# Patient Record
Sex: Male | Born: 1960 | Race: Black or African American | Hispanic: No | Marital: Single
Health system: Southern US, Community
[De-identification: ages and names within clinical notes are randomized; demographics above are authoritative.]

## PROBLEM LIST (undated history)

## (undated) DIAGNOSIS — M6740 Ganglion, unspecified site: Secondary | ICD-10-CM

## (undated) DIAGNOSIS — E7800 Pure hypercholesterolemia, unspecified: Secondary | ICD-10-CM

## (undated) DIAGNOSIS — E785 Hyperlipidemia, unspecified: Secondary | ICD-10-CM

## (undated) HISTORY — PX: HERNIA REPAIR: SHX51

## (undated) HISTORY — DX: Hyperlipidemia, unspecified: E78.5

---

## 2005-04-26 ENCOUNTER — Encounter: Admission: RE | Admit: 2005-04-26 | Discharge: 2005-04-26 | Payer: Self-pay | Admitting: Family Medicine

## 2006-11-07 ENCOUNTER — Encounter: Admission: RE | Admit: 2006-11-07 | Discharge: 2006-11-07 | Payer: Self-pay | Admitting: Family Medicine

## 2007-03-14 ENCOUNTER — Encounter: Admission: RE | Admit: 2007-03-14 | Discharge: 2007-06-12 | Payer: Self-pay | Admitting: Family Medicine

## 2007-04-23 ENCOUNTER — Emergency Department (HOSPITAL_COMMUNITY): Admission: EM | Admit: 2007-04-23 | Discharge: 2007-04-23 | Payer: Self-pay | Admitting: Emergency Medicine

## 2007-09-26 ENCOUNTER — Encounter: Admission: RE | Admit: 2007-09-26 | Discharge: 2007-09-26 | Payer: Self-pay | Admitting: Family Medicine

## 2009-01-15 ENCOUNTER — Encounter: Admission: RE | Admit: 2009-01-15 | Discharge: 2009-01-15 | Payer: Self-pay | Admitting: Family Medicine

## 2009-01-16 ENCOUNTER — Encounter: Admission: RE | Admit: 2009-01-16 | Discharge: 2009-01-16 | Payer: Self-pay | Admitting: Neurosurgery

## 2010-12-03 ENCOUNTER — Emergency Department (HOSPITAL_COMMUNITY)
Admission: EM | Admit: 2010-12-03 | Discharge: 2010-12-03 | Disposition: A | Discharge status: Home / Self Care | Payer: BC Managed Care – PPO | Attending: Emergency Medicine | Admitting: Emergency Medicine

## 2010-12-03 ENCOUNTER — Encounter (HOSPITAL_COMMUNITY): Payer: Self-pay

## 2010-12-03 ENCOUNTER — Emergency Department (HOSPITAL_COMMUNITY): Payer: BC Managed Care – PPO

## 2010-12-03 DIAGNOSIS — R109 Unspecified abdominal pain: Secondary | ICD-10-CM | POA: Insufficient documentation

## 2010-12-03 DIAGNOSIS — N201 Calculus of ureter: Secondary | ICD-10-CM | POA: Insufficient documentation

## 2010-12-03 DIAGNOSIS — E7800 Pure hypercholesterolemia, unspecified: Secondary | ICD-10-CM | POA: Insufficient documentation

## 2010-12-03 LAB — URINE MICROSCOPIC-ADD ON

## 2010-12-03 LAB — URINALYSIS, ROUTINE W REFLEX MICROSCOPIC
Bilirubin Urine: NEGATIVE
pH: 5 (ref 5.0–8.0)

## 2010-12-04 LAB — POCT I-STAT, CHEM 8
BUN: 20 mg/dL (ref 6–23)
Calcium, Ion: 1.19 mmol/L (ref 1.12–1.32)
Creatinine, Ser: 1.4 mg/dL (ref 0.4–1.5)
Hemoglobin: 17 g/dL (ref 13.0–17.0)
Potassium: 4.6 mEq/L (ref 3.5–5.1)

## 2010-12-06 ENCOUNTER — Emergency Department (HOSPITAL_COMMUNITY)
Admission: EM | Admit: 2010-12-06 | Discharge: 2010-12-06 | Disposition: A | Discharge status: Home / Self Care | Payer: BC Managed Care – PPO | Attending: Emergency Medicine | Admitting: Emergency Medicine

## 2010-12-06 ENCOUNTER — Emergency Department (HOSPITAL_COMMUNITY): Payer: BC Managed Care – PPO

## 2010-12-06 DIAGNOSIS — N23 Unspecified renal colic: Secondary | ICD-10-CM | POA: Insufficient documentation

## 2010-12-06 DIAGNOSIS — N289 Disorder of kidney and ureter, unspecified: Secondary | ICD-10-CM | POA: Insufficient documentation

## 2010-12-06 DIAGNOSIS — R109 Unspecified abdominal pain: Secondary | ICD-10-CM | POA: Insufficient documentation

## 2010-12-06 LAB — URINALYSIS, ROUTINE W REFLEX MICROSCOPIC
Protein, ur: 30 mg/dL — AB
Specific Gravity, Urine: 1.039 — ABNORMAL HIGH (ref 1.005–1.030)
Urobilinogen, UA: 0.2 mg/dL (ref 0.0–1.0)

## 2010-12-06 LAB — POCT I-STAT, CHEM 8
Calcium, Ion: 1.1 mmol/L — ABNORMAL LOW (ref 1.12–1.32)
Chloride: 104 mEq/L (ref 96–112)
Creatinine, Ser: 1.9 mg/dL — ABNORMAL HIGH (ref 0.4–1.5)
Glucose, Bld: 99 mg/dL (ref 70–99)
Hemoglobin: 15.6 g/dL (ref 13.0–17.0)

## 2010-12-06 LAB — URINE MICROSCOPIC-ADD ON

## 2010-12-07 LAB — URINE CULTURE: Culture: NO GROWTH

## 2010-12-09 ENCOUNTER — Inpatient Hospital Stay (HOSPITAL_COMMUNITY): Payer: BC Managed Care – PPO

## 2010-12-09 ENCOUNTER — Inpatient Hospital Stay (HOSPITAL_COMMUNITY)
Admission: EM | Admit: 2010-12-09 | Discharge: 2010-12-10 | DRG: 323 | Disposition: A | Discharge status: Home / Self Care | Payer: BC Managed Care – PPO | Attending: Urology | Admitting: Urology

## 2010-12-09 DIAGNOSIS — N201 Calculus of ureter: Principal | ICD-10-CM | POA: Diagnosis present

## 2010-12-09 DIAGNOSIS — Z79899 Other long term (current) drug therapy: Secondary | ICD-10-CM

## 2010-12-09 DIAGNOSIS — E7800 Pure hypercholesterolemia, unspecified: Secondary | ICD-10-CM | POA: Diagnosis present

## 2010-12-09 DIAGNOSIS — N200 Calculus of kidney: Secondary | ICD-10-CM | POA: Diagnosis present

## 2010-12-09 DIAGNOSIS — Z87442 Personal history of urinary calculi: Secondary | ICD-10-CM

## 2010-12-09 DIAGNOSIS — N1330 Unspecified hydronephrosis: Secondary | ICD-10-CM | POA: Diagnosis present

## 2010-12-09 LAB — URINE MICROSCOPIC-ADD ON

## 2010-12-09 LAB — POCT I-STAT, CHEM 8
BUN: 16 mg/dL (ref 6–23)
Chloride: 104 mEq/L (ref 96–112)
Creatinine, Ser: 1.5 mg/dL (ref 0.4–1.5)
Glucose, Bld: 111 mg/dL — ABNORMAL HIGH (ref 70–99)
Potassium: 4.2 mEq/L (ref 3.5–5.1)
Sodium: 138 mEq/L (ref 135–145)
TCO2: 25 mmol/L (ref 0–100)

## 2010-12-09 LAB — URINALYSIS, ROUTINE W REFLEX MICROSCOPIC
Bilirubin Urine: NEGATIVE
Ketones, ur: NEGATIVE mg/dL
Nitrite: NEGATIVE

## 2010-12-10 ENCOUNTER — Inpatient Hospital Stay (HOSPITAL_COMMUNITY): Payer: BC Managed Care – PPO

## 2011-04-03 HISTORY — PX: SHOULDER SURGERY: SHX246

## 2011-05-13 LAB — CBC
Platelets: 188
RBC: 4.67

## 2011-05-13 LAB — DIFFERENTIAL
Basophils Absolute: 0.1
Eosinophils Absolute: 0.2
Eosinophils Relative: 1
Lymphs Abs: 3.4 — ABNORMAL HIGH
Monocytes Absolute: 0.7

## 2011-05-13 LAB — I-STAT 8, (EC8 V) (CONVERTED LAB)
BUN: 15
Bicarbonate: 26.7 — ABNORMAL HIGH
HCT: 46
Operator id: 279831
pCO2, Ven: 40 — ABNORMAL LOW
pH, Ven: 7.433 — ABNORMAL HIGH

## 2011-05-13 LAB — URINE MICROSCOPIC-ADD ON

## 2011-05-13 LAB — URINALYSIS, ROUTINE W REFLEX MICROSCOPIC
Glucose, UA: NEGATIVE
Specific Gravity, Urine: 1.025
Urobilinogen, UA: 1

## 2011-05-13 LAB — POCT I-STAT CREATININE: Creatinine, Ser: 1.4

## 2012-01-04 ENCOUNTER — Encounter (INDEPENDENT_AMBULATORY_CARE_PROVIDER_SITE_OTHER): Payer: Self-pay

## 2012-01-24 ENCOUNTER — Encounter (INDEPENDENT_AMBULATORY_CARE_PROVIDER_SITE_OTHER): Payer: Self-pay | Admitting: Surgery

## 2012-01-24 ENCOUNTER — Ambulatory Visit (INDEPENDENT_AMBULATORY_CARE_PROVIDER_SITE_OTHER): Payer: BC Managed Care – PPO | Admitting: Surgery

## 2012-01-24 VITALS — BP 124/74 | HR 62 | Temp 97.3°F | Ht 75.0 in | Wt 215.4 lb

## 2012-01-24 DIAGNOSIS — K429 Umbilical hernia without obstruction or gangrene: Secondary | ICD-10-CM

## 2012-01-24 NOTE — Progress Notes (Signed)
Subjective:     Patient ID: Allen Rodriguez, male   DOB: 1960-11-12, 51 y.o.   MRN: [id number]  HPI  DOMINGO FUSON  June 19, 1961 [id number]  Patient Care Team: Benita Stabile, MD as PCP - General (Family Medicine)  This patient is a 51 y.o.male who presents today for surgical evaluation at the request of Dr. Clovis Riley.   Reason for visit: Hernia near her bellybutton.  The patient is an active healthy male. He felt a painful lump above his bellybutton a few months ago. Saw his primary care physician. The lump did not go away. It intermittently comes and goes. It is annoying and bothersome at times. No nausea or vomiting. Has daily bowel movements. No history of prior hernias. No history of infections. Because the mass has become more symptomatic and has not resolved, Dr. Clovis Riley sent the patient to consider surgery.  Patient Active Problem List  Diagnosis  . Supraumbilical hernia    Past Medical History  Diagnosis Date  . Hyperlipidemia     Past Surgical History  Procedure Date  . Shoulder surgery 09/12    left    History   Social History  . Marital Status: Married    Spouse Name: N/A    Number of Children: N/A  . Years of Education: N/A   Occupational History  . Not on file.   Social History Main Topics  . Smoking status: Never Smoker   . Smokeless tobacco: Not on file  . Alcohol Use: Yes     1-2x week  . Drug Use: No  . Sexually Active:    Other Topics Concern  . Not on file   Social History Narrative  . No narrative on file    Family History  Problem Relation Age of Onset  . Diabetes Father     Current Outpatient Prescriptions  Medication Sig Dispense Refill  . simvastatin (ZOCOR) 40 MG tablet Take 40 mg by mouth every evening.         No Known Allergies  BP 124/74  Pulse 62  Temp 97.3 F (36.3 C) (Temporal)  Ht 6\' 3"  (1.905 m)  Wt 215 lb 6.4 oz (97.705 kg)  BMI 26.92 kg/m2  SpO2 98%  No results found.   Review of Systems    Constitutional: Negative for fever, chills and diaphoresis.  HENT: Negative for nosebleeds, sore throat, facial swelling, mouth sores, trouble swallowing and ear discharge.   Eyes: Negative for photophobia, discharge and visual disturbance.  Respiratory: Negative for choking, chest tightness, shortness of breath and stridor.   Cardiovascular: Negative for chest pain and palpitations.       Patient walks 60 minutes for about 2 miles without difficulty.  No exertional chest/neck/shoulder/arm pain.  Gastrointestinal: Positive for abdominal pain. Negative for nausea, vomiting, diarrhea, constipation, blood in stool, abdominal distention, anal bleeding and rectal pain.  Genitourinary: Negative for dysuria, urgency, difficulty urinating and testicular pain.  Musculoskeletal: Negative for myalgias, back pain, arthralgias and gait problem.  Skin: Negative for color change, pallor, rash and wound.  Neurological: Negative for dizziness, speech difficulty, weakness, numbness and headaches.  Hematological: Negative for adenopathy. Does not bruise/bleed easily.  Psychiatric/Behavioral: Negative for hallucinations, confusion and agitation.       Objective:   Physical Exam  Constitutional: He is oriented to person, place, and time. He appears well-developed and well-nourished. No distress.  HENT:  Head: Normocephalic.  Mouth/Throat: Oropharynx is clear and moist. No oropharyngeal exudate.  Eyes: Conjunctivae and EOM are  normal. Pupils are equal, round, and reactive to light. No scleral icterus.  Neck: Normal range of motion. Neck supple. No tracheal deviation present.  Cardiovascular: Normal rate, regular rhythm and intact distal pulses.   Pulmonary/Chest: Effort normal and breath sounds normal. No respiratory distress.  Abdominal: Soft. He exhibits no distension. There is no tenderness. Hernia confirmed negative in the right inguinal area and confirmed negative in the left inguinal area.     Musculoskeletal: Normal range of motion. He exhibits no tenderness.  Lymphadenopathy:    He has no cervical adenopathy.       Right: No inguinal adenopathy present.       Left: No inguinal adenopathy present.  Neurological: He is alert and oriented to person, place, and time. No cranial nerve deficit. He exhibits normal muscle tone. Coordination normal.  Skin: Skin is warm and dry. No rash noted. He is not diaphoretic. No erythema. No pallor.  Psychiatric: He has a normal mood and affect. His behavior is normal. Judgment and thought content normal.       Assessment:     Supraumbilical VWH    Plan:     Although is a small hernia, it is becoming more comfortable. I offered repair. I think this is small enough that just a primary repair is needed. Probably can do this through an incision through the superior umbilical fold since it is close to the belly button  The anatomy & physiology of the abdominal wall was discussed.  The pathophysiology of hernias was discussed.  Natural history risks without surgery including progeressive enlargement, pain, incarceration & strangulation was discussed.   Contributors to complications such as smoking, obesity, diabetes, prior surgery, etc were discussed.   I feel the risks of no intervention will lead to serious problems that outweigh the operative risks; therefore, I recommended surgery to reduce and repair the hernia.  I explained laparoscopic techniques with possible need for an open approach.  I noted the probable use of mesh to patch and/or buttress the hernia repair  Risks such as bleeding, infection, abscess, need for further treatment, heart attack, death, and other risks were discussed.  I noted a good likelihood this will help address the problem.   Goals of post-operative recovery were discussed as well.  Possibility that this will not correct all symptoms was explained.  I stressed the importance of low-impact activity, aggressive pain  control, avoiding constipation, & not pushing through pain to minimize risk of post-operative chronic pain or injury. Possibility of reherniation especially with smoking, obesity, diabetes, immunosuppression, and other health conditions was discussed.  We will work to minimize complications.     An educational handout further explaining the pathology & treatment options was given as well.  Questions were answered.  The patient expresses understanding & wishes to proceed with surgery.

## 2012-01-24 NOTE — Patient Instructions (Addendum)
Hernia  A hernia occurs when an internal organ pushes out through a weak spot in the abdominal wall. Hernias most commonly occur in the groin and around the navel. Hernias often can be pushed back into place (reduced). Most hernias tend to get worse over time. Some abdominal hernias can get stuck in the opening (irreducible or incarcerated hernia) and cannot be reduced. An irreducible abdominal hernia which is tightly squeezed into the opening is at risk for impaired blood supply (strangulated hernia). A strangulated hernia is a medical emergency. Because of the risk for an irreducible or strangulated hernia, surgery may be recommended to repair a hernia.  CAUSES    Heavy lifting.   Prolonged coughing.   Straining to have a bowel movement.   A cut (incision) made during an abdominal surgery.  HOME CARE INSTRUCTIONS    Bed rest is not required. You may continue your normal activities.   Avoid lifting more than 10 pounds (4.5 kg) or straining.   Cough gently. If you are a smoker it is best to stop. Even the best hernia repair can break down with the continual strain of coughing. Even if you do not have your hernia repaired, a cough will continue to aggravate the problem.   Do not wear anything tight over your hernia. Do not try to keep it in with an outside bandage or truss. These can damage abdominal contents if they are trapped within the hernia sac.   Eat a normal diet.   Avoid constipation. Straining over long periods of time will increase hernia size and encourage breakdown of repairs. If you cannot do this with diet alone, stool softeners may be used.  SEEK IMMEDIATE MEDICAL CARE IF:    You have a fever.   You develop increasing abdominal pain.   You feel nauseous or vomit.   Your hernia is stuck outside the abdomen, looks discolored, feels hard, or is tender.   You have any changes in your bowel habits or in the hernia that are unusual for you.   You have increased pain or swelling around the  hernia.   You cannot push the hernia back in place by applying gentle pressure while lying down.  MAKE SURE YOU:    Understand these instructions.   Will watch your condition.   Will get help right away if you are not doing well or get worse.  Document Released: 07/19/2005 Document Revised: 07/08/2011 Document Reviewed: 03/07/2008  [employer] Patient Information 2012 [employer]

## 2012-02-25 DIAGNOSIS — K436 Other and unspecified ventral hernia with obstruction, without gangrene: Secondary | ICD-10-CM

## 2012-02-25 HISTORY — PX: UMBILICAL HERNIA REPAIR: SHX196

## 2012-03-07 ENCOUNTER — Telehealth (INDEPENDENT_AMBULATORY_CARE_PROVIDER_SITE_OTHER): Payer: Self-pay

## 2012-03-07 NOTE — Telephone Encounter (Signed)
LMOM giving the pt his f/u appt with Dr Michaell Cowing on 04/07/12.

## 2012-03-13 ENCOUNTER — Telehealth (INDEPENDENT_AMBULATORY_CARE_PROVIDER_SITE_OTHER): Payer: Self-pay | Admitting: General Surgery

## 2012-03-13 NOTE — Telephone Encounter (Signed)
Pt calling for post op appt; said no one had called him back.  Informed of appt on 04/07/12.  Discussed reasonable restrictions to employ (no lifting, carrying, pushing or pulling > 20 lbs) until cleared by MD.  He understands.

## 2012-04-07 ENCOUNTER — Encounter (INDEPENDENT_AMBULATORY_CARE_PROVIDER_SITE_OTHER): Payer: Self-pay | Admitting: Surgery

## 2012-04-07 ENCOUNTER — Ambulatory Visit (INDEPENDENT_AMBULATORY_CARE_PROVIDER_SITE_OTHER): Payer: BC Managed Care – PPO | Admitting: Surgery

## 2012-04-07 ENCOUNTER — Encounter (INDEPENDENT_AMBULATORY_CARE_PROVIDER_SITE_OTHER): Payer: BC Managed Care – PPO | Admitting: General Surgery

## 2012-04-07 VITALS — BP 118/78 | HR 80 | Resp 16 | Ht 75.0 in | Wt 219.0 lb

## 2012-04-07 DIAGNOSIS — K429 Umbilical hernia without obstruction or gangrene: Secondary | ICD-10-CM

## 2012-04-07 NOTE — Progress Notes (Signed)
Subjective:     Patient ID: Allen Rodriguez, male   DOB: 12/04/1960, 51 y.o.   MRN: [id number]  HPI  Allen Rodriguez  08-14-1960 [id number]  Patient Care Team: Allen Stabile, MD as PCP - General (Family Medicine)  This patient is a 51 y.o.male who presents today for surgical evaluation.   Procedure: Open supraumbilical ventral hernia repair.  Patient comes today feeling well.  Six weeks out from surgery.  No pain.  Energy level good.  No fevers or chills.  Regular bowel movements.  Patient Active Problem List  Diagnosis  . Supraumbilical hernia    Past Medical History  Diagnosis Date  . Hyperlipidemia     Past Surgical History  Procedure Date  . Shoulder surgery 09/12    left  . Umbilical hernia repair 02/25/12    History   Social History  . Marital Status: Married    Spouse Name: N/A    Number of Children: N/A  . Years of Education: N/A   Occupational History  . Not on file.   Social History Main Topics  . Smoking status: Never Smoker   . Smokeless tobacco: Not on file  . Alcohol Use: Yes     1-2x week  . Drug Use: No  . Sexually Active:    Other Topics Concern  . Not on file   Social History Narrative  . No narrative on file    Family History  Problem Relation Age of Onset  . Diabetes Father     Current Outpatient Prescriptions  Medication Sig Dispense Refill  . simvastatin (ZOCOR) 40 MG tablet Take 40 mg by mouth every evening.         No Known Allergies  BP 118/78  Pulse 80  Resp 16  Ht 6\' 3"  (1.905 m)  Wt 219 lb (99.338 kg)  BMI 27.37 kg/m2  No results found.   Review of Systems  Constitutional: Negative for fever, chills and diaphoresis.  HENT: Negative for sore throat, trouble swallowing and neck pain.   Eyes: Negative for photophobia and visual disturbance.  Respiratory: Negative for choking and shortness of breath.   Cardiovascular: Negative for chest pain and palpitations.  Gastrointestinal: Negative for nausea,  vomiting, abdominal distention, anal bleeding and rectal pain.  Genitourinary: Negative for dysuria, urgency, difficulty urinating and testicular pain.  Musculoskeletal: Negative for myalgias, arthralgias and gait problem.  Skin: Negative for color change and rash.  Neurological: Negative for dizziness, speech difficulty, weakness and numbness.  Hematological: Negative for adenopathy.  Psychiatric/Behavioral: Negative for hallucinations, confusion and agitation.       Objective:   Physical Exam  Constitutional: He is oriented to person, place, and time. He appears well-developed and well-nourished. No distress.  HENT:  Head: Normocephalic.  Mouth/Throat: Oropharynx is clear and moist. No oropharyngeal exudate.  Eyes: Conjunctivae and EOM are normal. Pupils are equal, round, and reactive to light. No scleral icterus.  Neck: Normal range of motion. No tracheal deviation present.  Cardiovascular: Normal rate, normal heart sounds and intact distal pulses.   Pulmonary/Chest: Effort normal. No respiratory distress.  Abdominal: Soft. He exhibits no distension. There is no tenderness. Hernia confirmed negative in the right inguinal area and confirmed negative in the left inguinal area.       Incisions clean with normal healing ridges.  No hernias  Musculoskeletal: Normal range of motion. He exhibits no tenderness.  Neurological: He is alert and oriented to person, place, and time. No cranial nerve deficit. He  exhibits normal muscle tone. Coordination normal.  Skin: Skin is warm and dry. No rash noted. He is not diaphoretic.  Psychiatric: He has a normal mood and affect. His behavior is normal.       Assessment:     6 weeks s/p primary supraumbilical VWH repair, recovering well    Plan:     Increase activity as tolerated.  Do not push through pain.  Advanced on diet as tolerated. Bowel regimen to avoid problems.  Return to clinic p.r.n. The patient expressed understanding and  appreciation

## 2015-04-18 ENCOUNTER — Encounter (HOSPITAL_BASED_OUTPATIENT_CLINIC_OR_DEPARTMENT_OTHER): Payer: Self-pay | Admitting: *Deleted

## 2015-04-18 ENCOUNTER — Other Ambulatory Visit: Payer: Self-pay | Admitting: Orthopedic Surgery

## 2015-04-24 ENCOUNTER — Encounter (HOSPITAL_BASED_OUTPATIENT_CLINIC_OR_DEPARTMENT_OTHER): Payer: Self-pay

## 2015-04-24 ENCOUNTER — Ambulatory Visit (HOSPITAL_BASED_OUTPATIENT_CLINIC_OR_DEPARTMENT_OTHER)
Admission: RE | Admit: 2015-04-24 | Discharge: 2015-04-24 | Disposition: A | Discharge status: Home / Self Care | Payer: BLUE CROSS/BLUE SHIELD | Source: Ambulatory Visit | Attending: Orthopedic Surgery | Admitting: Orthopedic Surgery

## 2015-04-24 ENCOUNTER — Ambulatory Visit (HOSPITAL_BASED_OUTPATIENT_CLINIC_OR_DEPARTMENT_OTHER): Payer: BLUE CROSS/BLUE SHIELD | Admitting: Anesthesiology

## 2015-04-24 ENCOUNTER — Encounter (HOSPITAL_BASED_OUTPATIENT_CLINIC_OR_DEPARTMENT_OTHER)
Admission: RE | Disposition: A | Discharge status: Home / Self Care | Payer: Self-pay | Source: Ambulatory Visit | Attending: Orthopedic Surgery

## 2015-04-24 DIAGNOSIS — E785 Hyperlipidemia, unspecified: Secondary | ICD-10-CM | POA: Insufficient documentation

## 2015-04-24 DIAGNOSIS — L570 Actinic keratosis: Secondary | ICD-10-CM | POA: Insufficient documentation

## 2015-04-24 DIAGNOSIS — M67844 Other specified disorders of tendon, left hand: Secondary | ICD-10-CM | POA: Diagnosis present

## 2015-04-24 DIAGNOSIS — D367 Benign neoplasm of other specified sites: Secondary | ICD-10-CM | POA: Diagnosis not present

## 2015-04-24 HISTORY — DX: Ganglion, unspecified site: M67.40

## 2015-04-24 HISTORY — PX: MASS EXCISION: SHX2000

## 2015-04-24 SURGERY — EXCISION MASS
Anesthesia: Monitor Anesthesia Care | Site: Finger | Laterality: Left

## 2015-04-24 MED ORDER — CEFAZOLIN SODIUM-DEXTROSE 2-3 GM-% IV SOLR
INTRAVENOUS | Status: AC
  Filled 2015-04-24: qty 50

## 2015-04-24 MED ORDER — DEXAMETHASONE SODIUM PHOSPHATE 10 MG/ML IJ SOLN
INTRAMUSCULAR | Status: AC
  Filled 2015-04-24: qty 1

## 2015-04-24 MED ORDER — GLYCOPYRROLATE 0.2 MG/ML IJ SOLN: 0.2000 mg | Freq: Once | INTRAMUSCULAR | Status: DC | PRN

## 2015-04-24 MED ORDER — LIDOCAINE HCL (CARDIAC) 20 MG/ML IV SOLN
INTRAVENOUS | Status: AC
  Filled 2015-04-24: qty 5

## 2015-04-24 MED ORDER — CHLORHEXIDINE GLUCONATE 4 % EX LIQD: 60.0000 mL | Freq: Once | CUTANEOUS | Status: DC

## 2015-04-24 MED ORDER — LACTATED RINGERS IV SOLN
INTRAVENOUS | Status: DC
  Administered 2015-04-24 (×2): via INTRAVENOUS

## 2015-04-24 MED ORDER — BUPIVACAINE HCL (PF) 0.25 % IJ SOLN
INTRAMUSCULAR | Status: DC | PRN
  Administered 2015-04-24: 10 mL

## 2015-04-24 MED ORDER — FENTANYL CITRATE (PF) 100 MCG/2ML IJ SOLN
INTRAMUSCULAR | Status: AC
  Filled 2015-04-24: qty 4

## 2015-04-24 MED ORDER — HYDROCODONE-ACETAMINOPHEN 5-325 MG PO TABS: ORAL_TABLET | ORAL | Status: AC

## 2015-04-24 MED ORDER — MIDAZOLAM HCL 2 MG/2ML IJ SOLN
1.0000 mg | INTRAMUSCULAR | Status: DC | PRN
  Administered 2015-04-24: 2 mg via INTRAVENOUS

## 2015-04-24 MED ORDER — SCOPOLAMINE 1 MG/3DAYS TD PT72: 1.0000 | MEDICATED_PATCH | Freq: Once | TRANSDERMAL | Status: DC | PRN

## 2015-04-24 MED ORDER — FENTANYL CITRATE (PF) 100 MCG/2ML IJ SOLN: 25.0000 ug | INTRAMUSCULAR | Status: DC | PRN

## 2015-04-24 MED ORDER — FENTANYL CITRATE (PF) 100 MCG/2ML IJ SOLN
50.0000 ug | INTRAMUSCULAR | Status: DC | PRN
  Administered 2015-04-24: 100 ug via INTRAVENOUS

## 2015-04-24 MED ORDER — ONDANSETRON HCL 4 MG/2ML IJ SOLN
INTRAMUSCULAR | Status: DC | PRN
  Administered 2015-04-24: 4 mg via INTRAVENOUS

## 2015-04-24 MED ORDER — PROPOFOL INFUSION 10 MG/ML OPTIME
INTRAVENOUS | Status: DC | PRN
  Administered 2015-04-24: 100 ug/kg/min via INTRAVENOUS

## 2015-04-24 MED ORDER — ONDANSETRON HCL 4 MG/2ML IJ SOLN
INTRAMUSCULAR | Status: AC
  Filled 2015-04-24: qty 2

## 2015-04-24 MED ORDER — LIDOCAINE HCL (CARDIAC) 20 MG/ML IV SOLN
INTRAVENOUS | Status: DC | PRN
  Administered 2015-04-24: 50 mg via INTRAVENOUS

## 2015-04-24 MED ORDER — CEFAZOLIN SODIUM-DEXTROSE 2-3 GM-% IV SOLR
2.0000 g | INTRAVENOUS | Status: AC
  Administered 2015-04-24: 2 g via INTRAVENOUS

## 2015-04-24 MED ORDER — MIDAZOLAM HCL 2 MG/2ML IJ SOLN
INTRAMUSCULAR | Status: AC
  Filled 2015-04-24: qty 4

## 2015-04-24 MED ORDER — PROMETHAZINE HCL 25 MG/ML IJ SOLN
6.2500 mg | INTRAMUSCULAR | Status: DC | PRN
Start: 2015-04-24 — End: 2015-04-24

## 2015-04-24 SURGICAL SUPPLY — 57 items
APL SKNCLS STERI-STRIP NONHPOA (GAUZE/BANDAGES/DRESSINGS)
BANDAGE COBAN STERILE 2 (GAUZE/BANDAGES/DRESSINGS) IMPLANT
BANDAGE ELASTIC 3 VELCRO ST LF (GAUZE/BANDAGES/DRESSINGS) IMPLANT
BENZOIN TINCTURE PRP APPL 2/3 (GAUZE/BANDAGES/DRESSINGS) IMPLANT
BLADE MINI RND TIP GREEN BEAV (BLADE) IMPLANT
BLADE SURG 15 STRL LF DISP TIS (BLADE) ×2 IMPLANT
BLADE SURG 15 STRL SS (BLADE) ×4
BNDG CMPR 9X4 STRL LF SNTH (GAUZE/BANDAGES/DRESSINGS) ×1
BNDG CMPR MD 5X2 ELC HKLP STRL (GAUZE/BANDAGES/DRESSINGS)
BNDG COHESIVE 1X5 TAN STRL LF (GAUZE/BANDAGES/DRESSINGS) ×1 IMPLANT
BNDG CONFORM 2 STRL LF (GAUZE/BANDAGES/DRESSINGS) IMPLANT
BNDG ELASTIC 2 VLCR STRL LF (GAUZE/BANDAGES/DRESSINGS) IMPLANT
BNDG ESMARK 4X9 LF (GAUZE/BANDAGES/DRESSINGS) ×1 IMPLANT
BNDG GAUZE 1X2.1 STRL (MISCELLANEOUS) IMPLANT
BNDG GAUZE ELAST 4 BULKY (GAUZE/BANDAGES/DRESSINGS) IMPLANT
BNDG PLASTER X FAST 3X3 WHT LF (CAST SUPPLIES) IMPLANT
BNDG PLSTR 9X3 FST ST WHT (CAST SUPPLIES)
CHLORAPREP W/TINT 26ML (MISCELLANEOUS) ×2 IMPLANT
CORDS BIPOLAR (ELECTRODE) ×2 IMPLANT
COVER BACK TABLE 60X90IN (DRAPES) ×2 IMPLANT
COVER MAYO STAND STRL (DRAPES) ×2 IMPLANT
CUFF TOURNIQUET SINGLE 18IN (TOURNIQUET CUFF) ×2 IMPLANT
DRAPE EXTREMITY T 121X128X90 (DRAPE) ×2 IMPLANT
DRAPE SURG 17X23 STRL (DRAPES) ×2 IMPLANT
GAUZE SPONGE 4X4 12PLY STRL (GAUZE/BANDAGES/DRESSINGS) ×2 IMPLANT
GAUZE XEROFORM 1X8 LF (GAUZE/BANDAGES/DRESSINGS) ×2 IMPLANT
GLOVE BIO SURGEON STRL SZ7.5 (GLOVE) ×2 IMPLANT
GLOVE BIOGEL PI IND STRL 7.0 (GLOVE) IMPLANT
GLOVE BIOGEL PI IND STRL 8 (GLOVE) ×1 IMPLANT
GLOVE BIOGEL PI INDICATOR 7.0 (GLOVE) ×2
GLOVE BIOGEL PI INDICATOR 8 (GLOVE) ×1
GLOVE ECLIPSE 6.5 STRL STRAW (GLOVE) ×1 IMPLANT
GOWN STRL REUS W/ TWL LRG LVL3 (GOWN DISPOSABLE) ×1 IMPLANT
GOWN STRL REUS W/TWL LRG LVL3 (GOWN DISPOSABLE) ×2
GOWN STRL REUS W/TWL XL LVL3 (GOWN DISPOSABLE) ×2 IMPLANT
NDL HYPO 25X1 1.5 SAFETY (NEEDLE) ×1 IMPLANT
NEEDLE HYPO 25X1 1.5 SAFETY (NEEDLE) ×2 IMPLANT
NS IRRIG 1000ML POUR BTL (IV SOLUTION) ×2 IMPLANT
PACK BASIN DAY SURGERY FS (CUSTOM PROCEDURE TRAY) ×2 IMPLANT
PAD CAST 3X4 CTTN HI CHSV (CAST SUPPLIES) IMPLANT
PAD CAST 4YDX4 CTTN HI CHSV (CAST SUPPLIES) IMPLANT
PADDING CAST ABS 4INX4YD NS (CAST SUPPLIES) ×1
PADDING CAST ABS COTTON 4X4 ST (CAST SUPPLIES) ×1 IMPLANT
PADDING CAST COTTON 3X4 STRL (CAST SUPPLIES)
PADDING CAST COTTON 4X4 STRL (CAST SUPPLIES)
SPLINT FINGER 3.25 BULB 911905 (SOFTGOODS) ×1 IMPLANT
STOCKINETTE 4X48 STRL (DRAPES) ×2 IMPLANT
STRIP CLOSURE SKIN 1/2X4 (GAUZE/BANDAGES/DRESSINGS) IMPLANT
SUT ETHILON 3 0 PS 1 (SUTURE) IMPLANT
SUT ETHILON 4 0 PS 2 18 (SUTURE) IMPLANT
SUT ETHILON 5 0 P 3 18 (SUTURE) ×1
SUT NYLON ETHILON 5-0 P-3 1X18 (SUTURE) IMPLANT
SUT VIC AB 4-0 P2 18 (SUTURE) IMPLANT
SYR BULB 3OZ (MISCELLANEOUS) ×2 IMPLANT
SYR CONTROL 10ML LL (SYRINGE) ×2 IMPLANT
TOWEL OR 17X24 6PK STRL BLUE (TOWEL DISPOSABLE) ×2 IMPLANT
UNDERPAD 30X30 (UNDERPADS AND DIAPERS) ×1 IMPLANT

## 2015-04-24 NOTE — Brief Op Note (Signed)
04/24/2015  11:15 AM  PATIENT:  Allen Rodriguez  54 y.o. male  PRE-OPERATIVE DIAGNOSIS:  LEFT SMALL FINGER MUCOID CYST AND DIP  POST-OPERATIVE DIAGNOSIS:  LEFT SMALL FINGER WART  PROCEDURE:  Procedure(s): LEFT SMALL FINGER  EXCISION MASS (Left)  SURGEON:  Surgeon(s) and Role:    * Leanora Cover, MD - Primary  PHYSICIAN ASSISTANT:   ASSISTANTS: none   ANESTHESIA:   Bier block with sedation  EBL:  Total I/O In: 1000 [I.V.:1000] Out: -   BLOOD ADMINISTERED:none  DRAINS: none   LOCAL MEDICATIONS USED:  MARCAINE     SPECIMEN:  Source of Specimen:  left small finger  DISPOSITION OF SPECIMEN:  PATHOLOGY  COUNTS:  YES  TOURNIQUET:   Total Tourniquet Time Documented: Forearm (Left) - 19 minutes Total: Forearm (Left) - 19 minutes   DICTATION: .Other Dictation: Dictation Number [id number]  PLAN OF CARE: Discharge to home after PACU  PATIENT DISPOSITION:  PACU - hemodynamically stable.

## 2015-04-24 NOTE — Anesthesia Preprocedure Evaluation (Signed)
Anesthesia Evaluation  Patient identified by MRN, date of birth, ID band Patient awake    Reviewed: Allergy & Precautions, H&P , NPO status , Patient's Chart, lab work & pertinent test results  History of Anesthesia Complications Negative for: history of anesthetic complications  Airway Mallampati: II  TM Distance: >3 FB Neck ROM: full    Dental no notable dental hx.    Pulmonary neg pulmonary ROS,    Pulmonary exam normal breath sounds clear to auscultation       Cardiovascular negative cardio ROS Normal cardiovascular exam Rhythm:regular Rate:Normal     Neuro/Psych negative neurological ROS     GI/Hepatic negative GI ROS, Neg liver ROS,   Endo/Other  negative endocrine ROS  Renal/GU negative Renal ROS     Musculoskeletal   Abdominal   Peds  Hematology negative hematology ROS (+)   Anesthesia Other Findings   Reproductive/Obstetrics negative OB ROS                             Anesthesia Physical Anesthesia Plan  ASA: II  Anesthesia Plan: Bier Block and MAC   Post-op Pain Management:    Induction: Intravenous  Airway Management Planned: Simple Face Mask  Additional Equipment:   Intra-op Plan:   Post-operative Plan:   Informed Consent: I have reviewed the patients History and Physical, chart, labs and discussed the procedure including the risks, benefits and alternatives for the proposed anesthesia with the patient or authorized representative who has indicated his/her understanding and acceptance.   Dental Advisory Given  Plan Discussed with: Anesthesiologist, CRNA and Surgeon  Anesthesia Plan Comments:         Anesthesia Quick Evaluation

## 2015-04-24 NOTE — Anesthesia Postprocedure Evaluation (Signed)
  Anesthesia Post-op Note  Patient: Allen Rodriguez  Procedure(s) Performed: Procedure(s) (LRB): LEFT SMALL FINGER  EXCISION MASS (Left)  Patient Location: PACU  Anesthesia Type: MAC and Bier block  Level of Consciousness: awake and alert   Airway and Oxygen Therapy: Patient Spontanous Breathing  Post-op Pain: mild  Post-op Assessment: Post-op Vital signs reviewed, Patient's Cardiovascular Status Stable, Respiratory Function Stable, Patent Airway and No signs of Nausea or vomiting  Last Vitals:  Filed Vitals:   04/24/15 1145  BP: 123/87  Pulse: 61  Temp:   Resp: 14    Post-op Vital Signs: stable   Complications: No apparent anesthesia complications

## 2015-04-24 NOTE — H&P (Signed)
  Allen Rodriguez is an 54 y.o. male.   Chief Complaint: left small mucoid cyst HPI: 54 yo rhd male has noted a mass left small finger x 1 year.  It is bothersome to him.  He wishes to have it removed and dip joint debrided.  Past Medical History  Diagnosis Date  . Hyperlipidemia   . Mucoid cyst of joint     left small finger    Past Surgical History  Procedure Laterality Date  . Shoulder surgery  09/12    left  . Umbilical hernia repair  02/25/12  . Hernia repair      Family History  Problem Relation Age of Onset  . Diabetes Father    Social History:  reports that he has never smoked. He does not have any smokeless tobacco history on file. He reports that he drinks alcohol. He reports that he does not use illicit drugs.  Allergies: No Known Allergies  Medications Prior to Admission  Medication Sig Dispense Refill  . simvastatin (ZOCOR) 40 MG tablet Take 40 mg by mouth every evening.      No results found for this or any previous visit (from the past 48 hour(s)).  No results found.   A comprehensive review of systems was negative except for: Eyes: positive for contacts/glasses  Blood pressure 122/88, pulse 55, temperature 97.7 F (36.5 C), temperature source Oral, resp. rate 20, height 6\' 3"  (1.905 m), weight 103.239 kg (227 lb 9.6 oz), SpO2 100 %.  General appearance: alert, cooperative and appears stated age Head: Normocephalic, without obvious abnormality, atraumatic Neck: supple, symmetrical, trachea midline Resp: clear to auscultation bilaterally Cardio: regular rate and rhythm GI: non tender Extremities: intact sensation and capillary refill all digits.  +epl/fpl/io.  no wounds. Pulses: 2+ and symmetric Skin: Skin color, texture, turgor normal. 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  Non operative and operative treatment options were discussed with the  patient and patient wishes to proceed with operative treatment. Risks, benefits, and alternatives of surgery were discussed and the patient agrees with the plan of care.   KUZMA,KEVIN R 04/24/2015, 9:11 AM

## 2015-04-24 NOTE — Op Note (Signed)
[id number] 

## 2015-04-24 NOTE — Transfer of Care (Signed)
Immediate Anesthesia Transfer of Care Note  Patient: Allen Rodriguez  Procedure(s) Performed: Procedure(s): LEFT SMALL FINGER  EXCISION MASS (Left)  Patient Location: PACU  Anesthesia Type:MAC and Bier block  Level of Consciousness: sedated  Airway & Oxygen Therapy: Patient Spontanous Breathing and Patient connected to face mask oxygen  Post-op Assessment: Report given to RN and Post -op Vital signs reviewed and stable  Post vital signs: Reviewed and stable  Last Vitals:  Filed Vitals:   04/24/15 0829  BP: 122/88  Pulse: 55  Temp: 36.5 C  Resp: 20    Complications: No apparent anesthesia complications

## 2015-04-24 NOTE — Op Note (Signed)
Allen Rodriguez, JUDAY NO.:  [id number]  MEDICAL RECORD NO.:  [id number]  LOCATION:                                 FACILITY:  PHYSICIAN:  Leanora Cover, MD             DATE OF BIRTH:  DATE OF PROCEDURE:  04/24/2015 DATE OF DISCHARGE:                              OPERATIVE REPORT   PREOPERATIVE DIAGNOSIS:  Left small finger mucoid cyst and DIP joint arthrosis versus wart.  POSTOPERATIVE DIAGNOSIS:  Left small finger wart ~4 mm.  PROCEDURE:  Excision of mass left small finger by elliptical skin incision.  SURGEON:  Leanora Cover, MD.  ASSISTANT:  None.  ANESTHESIA:  Bier-block with sedation.  IV FLUIDS:  Per anesthesia flow sheet.  ESTIMATED BLOOD LOSS:  Minimal.  COMPLICATIONS:  None.  SPECIMENS:  Left small finger mass to Pathology.  TOURNIQUET TIME:  19 minutes.  DISPOSITION:  Stable to PACU.  INDICATIONS:  Allen Rodriguez is a 54 year old male who has had a mass on his left small finger.  It is bothersome to him.  He wishes to have it removed.  Risks, benefits, and alternatives of surgery were discussed, including risk of blood loss; infection; damage to nerves, vessels, tendons, ligaments, bone; failure of surgery; need for additional surgery; complications with wound healing, continued pain, recurrence of mass.  He voiced understanding of these risks and elected to proceed.  OPERATIVE COURSE:  After being identified preoperatively by myself, the patient and I agreed upon procedure and site of procedure.  Surgical site was marked.  Risks, benefits, and alternatives of surgery were reviewed and he wished to proceed.  Surgical consent had been signed. He was given IV Ancef as preoperative antibiotic prophylaxis.  He was transferred to the operating room and placed on the operating room table in supine position with the left upper extremity on arm board.  Bier block anesthesia was induced by the anesthesiologist.  The left upper extremity was prepped  and draped in normal sterile orthopedic fashion. Surgical pause was performed between surgeons, anesthesia, and operating room staff, and all were in agreement as to the patient, procedure, and site of procedure.  Tourniquet at the proximal aspect of the forearm had been inflated with a Bier block.  Incision was made at the site of the mass and carried into subcutaneous tissues by spreading technique. There was no cyst formation in the subcutaneous tissues.  The mass was sharply incised centrally and it was not cystic.  It was felt that this was a wart.  This was then ellipsed out.  It was sent to Pathology for examination.  Suture was placed distally.  The wound bed was explored. There was no evidence of a cyst.  The bipolar electrocautery was used to obtain hemostasis.  The wound was copiously irrigated with sterile saline and then closed with 5-0 nylon suture in a horizontal mattress fashion.  This provided good apposition of the skin edges.  Digital block was performed with 10 mL of 0.25% plain Marcaine to aid in postoperative analgesia.  The wound was then dressed with sterile Xeroform, 4x4, and wrapped with a Coban dressing lightly.  An Alumafoam  splint was placed and wrapped lightly with a Coban dressing.  Tourniquet was deflated at 19 minutes.  Fingertips were pink with brisk capillary refill after deflation of tourniquet.  Operative drapes were broken down.  The patient was awoken from anesthesia safely.  He was transferred back to the stretcher and taken to PACU in stable condition. I will see him back in the office in 1 week for postoperative followup. I will give him Norco 5/325, 1 to 2 p.o. q.6 hours p.r.n. pain, dispensed #30.     Leanora Cover, MD     KK/MEDQ  D:  04/24/2015  T:  04/24/2015  Job:  [id number]

## 2015-04-24 NOTE — Anesthesia Procedure Notes (Addendum)
Anesthesia Regional Block:  Bier block (IV Regional)  Pre-Anesthetic Checklist: ,, timeout performed, Correct Patient, Correct Site, Correct Laterality, Correct Procedure,, site marked, surgical consent,, at surgeon's request Needles:  Injection technique: Single-shot  Needle Type: Other      Needle Gauge: 20 and 20 G    Additional Needles: Bier block (IV Regional) Narrative:  Start time: 04/24/2015 10:51 AM End time: 04/24/2015 10:52 AM Injection made incrementally with aspirations every 35 mL.  Performed by: Personally   Additional Notes: Esmark wrap, torq inflated, neg pulse. Slowly injected 35 cc 0.5% pres free lidocaine. Pt tol well, no neg s/s

## 2015-04-24 NOTE — Discharge Instructions (Addendum)

## 2015-04-25 ENCOUNTER — Encounter (HOSPITAL_BASED_OUTPATIENT_CLINIC_OR_DEPARTMENT_OTHER): Payer: Self-pay | Admitting: Orthopedic Surgery

## 2020-03-25 ENCOUNTER — Emergency Department (HOSPITAL_COMMUNITY): Payer: Managed Care, Other (non HMO)

## 2020-03-25 ENCOUNTER — Encounter (HOSPITAL_COMMUNITY): Payer: Self-pay | Admitting: Emergency Medicine

## 2020-03-25 ENCOUNTER — Observation Stay (HOSPITAL_COMMUNITY)
Admission: EM | Admit: 2020-03-25 | Discharge: 2020-03-26 | Disposition: A | Discharge status: Home / Self Care | Payer: Managed Care, Other (non HMO) | Attending: Surgery | Admitting: Surgery

## 2020-03-25 DIAGNOSIS — E7800 Pure hypercholesterolemia, unspecified: Secondary | ICD-10-CM | POA: Insufficient documentation

## 2020-03-25 DIAGNOSIS — Z20822 Contact with and (suspected) exposure to covid-19: Secondary | ICD-10-CM | POA: Insufficient documentation

## 2020-03-25 DIAGNOSIS — S27321A Contusion of lung, unilateral, initial encounter: Secondary | ICD-10-CM

## 2020-03-25 DIAGNOSIS — S299XXA Unspecified injury of thorax, initial encounter: Secondary | ICD-10-CM | POA: Diagnosis present

## 2020-03-25 DIAGNOSIS — Y92410 Unspecified street and highway as the place of occurrence of the external cause: Secondary | ICD-10-CM | POA: Diagnosis not present

## 2020-03-25 DIAGNOSIS — S2231XA Fracture of one rib, right side, initial encounter for closed fracture: Secondary | ICD-10-CM | POA: Diagnosis not present

## 2020-03-25 DIAGNOSIS — S32492A Other specified fracture of left acetabulum, initial encounter for closed fracture: Secondary | ICD-10-CM | POA: Insufficient documentation

## 2020-03-25 DIAGNOSIS — Y9355 Activity, bike riding: Secondary | ICD-10-CM | POA: Diagnosis not present

## 2020-03-25 DIAGNOSIS — S270XXA Traumatic pneumothorax, initial encounter: Secondary | ICD-10-CM | POA: Diagnosis not present

## 2020-03-25 DIAGNOSIS — Y999 Unspecified external cause status: Secondary | ICD-10-CM | POA: Diagnosis not present

## 2020-03-25 DIAGNOSIS — Z79899 Other long term (current) drug therapy: Secondary | ICD-10-CM | POA: Diagnosis not present

## 2020-03-25 DIAGNOSIS — T1490XA Injury, unspecified, initial encounter: Secondary | ICD-10-CM | POA: Diagnosis present

## 2020-03-25 DIAGNOSIS — S32402A Unspecified fracture of left acetabulum, initial encounter for closed fracture: Secondary | ICD-10-CM

## 2020-03-25 DIAGNOSIS — J939 Pneumothorax, unspecified: Secondary | ICD-10-CM

## 2020-03-25 HISTORY — DX: Pure hypercholesterolemia, unspecified: E78.00

## 2020-03-25 MED ORDER — TETANUS-DIPHTH-ACELL PERTUSSIS 5-2.5-18.5 LF-MCG/0.5 IM SUSP: 0.5000 mL | Freq: Once | INTRAMUSCULAR | Status: DC

## 2020-03-25 MED ORDER — FENTANYL CITRATE (PF) 100 MCG/2ML IJ SOLN: 50.0000 ug | Freq: Once | INTRAMUSCULAR | Status: DC
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
## 2020-03-26 ENCOUNTER — Emergency Department (HOSPITAL_COMMUNITY): Payer: Managed Care, Other (non HMO)

## 2020-03-26 ENCOUNTER — Encounter: Payer: Self-pay | Admitting: Surgery

## 2020-03-26 ENCOUNTER — Observation Stay (HOSPITAL_COMMUNITY): Payer: Managed Care, Other (non HMO)

## 2020-03-26 DIAGNOSIS — T1490XA Injury, unspecified, initial encounter: Secondary | ICD-10-CM | POA: Diagnosis present

## 2020-03-26 LAB — HIV ANTIBODY (ROUTINE TESTING W REFLEX): HIV Screen 4th Generation wRfx: NONREACTIVE

## 2020-03-26 LAB — CBC
HCT: 41.7 % (ref 39.0–52.0)
HCT: 41 % (ref 39.0–52.0)
Hemoglobin: 14.9 g/dL (ref 13.0–17.0)
Hemoglobin: 14.3 g/dL (ref 13.0–17.0)
MCH: 31.7 pg (ref 26.0–34.0)
MCH: 30.8 pg (ref 26.0–34.0)
MCHC: 35.7 g/dL (ref 30.0–36.0)
MCHC: 34.9 g/dL (ref 30.0–36.0)
MCV: 88.7 fL (ref 80.0–100.0)
MCV: 88.4 fL (ref 80.0–100.0)
Platelets: 157 10*3/uL (ref 150–400)
Platelets: 159 10*3/uL (ref 150–400)
RBC: 4.7 MIL/uL (ref 4.22–5.81)
RBC: 4.64 MIL/uL (ref 4.22–5.81)
RDW: 13 % (ref 11.5–15.5)
RDW: 13.2 % (ref 11.5–15.5)
WBC: 14.8 10*3/uL — ABNORMAL HIGH (ref 4.0–10.5)
WBC: 10.7 10*3/uL — ABNORMAL HIGH (ref 4.0–10.5)
nRBC: 0 % (ref 0.0–0.2)
nRBC: 0 % (ref 0.0–0.2)

## 2020-03-26 LAB — COMPREHENSIVE METABOLIC PANEL
ALT: 24 U/L (ref 0–44)
AST: 26 U/L (ref 15–41)
Albumin: 3.9 g/dL (ref 3.5–5.0)
Alkaline Phosphatase: 80 U/L (ref 38–126)
Anion gap: 9 (ref 5–15)
BUN: 13 mg/dL (ref 6–20)
CO2: 23 mmol/L (ref 22–32)
Calcium: 9 mg/dL (ref 8.9–10.3)
Chloride: 106 mmol/L (ref 98–111)
Creatinine, Ser: 1.1 mg/dL (ref 0.61–1.24)
GFR calc Af Amer: >60 mL/min (ref >60)
GFR calc non Af Amer: >60 mL/min (ref >60)
Glucose, Bld: 127 mg/dL — ABNORMAL HIGH (ref 70–99)
Potassium: 3.7 mmol/L (ref 3.5–5.1)
Sodium: 138 mmol/L (ref 135–145)
Total Bilirubin: 0.9 mg/dL (ref 0.3–1.2)
Total Protein: 6.7 g/dL (ref 6.5–8.1)

## 2020-03-26 LAB — URINALYSIS, ROUTINE W REFLEX MICROSCOPIC
Bacteria, UA: NONE SEEN
Bilirubin Urine: NEGATIVE
Glucose, UA: NEGATIVE mg/dL
Ketones, ur: NEGATIVE mg/dL
Leukocytes,Ua: NEGATIVE
Nitrite: NEGATIVE
Protein, ur: NEGATIVE mg/dL
Specific Gravity, Urine: 1.023 (ref 1.005–1.030)
pH: 5 (ref 5.0–8.0)

## 2020-03-26 LAB — I-STAT CHEM 8, ED
BUN: 15 mg/dL (ref 6–20)
Calcium, Ion: 1.17 mmol/L (ref 1.15–1.40)
Chloride: 105 mmol/L (ref 98–111)
Creatinine, Ser: 1.1 mg/dL (ref 0.61–1.24)
Glucose, Bld: 123 mg/dL — ABNORMAL HIGH (ref 70–99)
HCT: 44 % (ref 39.0–52.0)
Hemoglobin: 15 g/dL (ref 13.0–17.0)
Potassium: 3.7 mmol/L (ref 3.5–5.1)
Sodium: 141 mmol/L (ref 135–145)
TCO2: 23 mmol/L (ref 22–32)

## 2020-03-26 LAB — PROTIME-INR
INR: 1 (ref 0.8–1.2)
Prothrombin Time: 12.9 seconds (ref 11.4–15.2)

## 2020-03-26 LAB — LACTIC ACID, PLASMA: Lactic Acid, Venous: 0.8 mmol/L (ref 0.5–1.9)

## 2020-03-26 LAB — ETHANOL: Alcohol, Ethyl (B): <10 mg/dL (ref <10)

## 2020-03-26 LAB — SAMPLE TO BLOOD BANK

## 2020-03-26 LAB — SARS CORONAVIRUS 2 BY RT PCR (HOSPITAL ORDER, PERFORMED IN ~~LOC~~ HOSPITAL LAB): SARS Coronavirus 2: NEGATIVE

## 2020-03-26 MED ORDER — ENOXAPARIN SODIUM 30 MG/0.3ML ~~LOC~~ SOLN
30.0000 mg | Freq: Two times a day (BID) | SUBCUTANEOUS | Status: DC
  Administered 2020-03-26: 30 mg via SUBCUTANEOUS
  Filled 2020-03-26: qty 0.3

## 2020-03-26 MED ORDER — FENTANYL CITRATE (PF) 100 MCG/2ML IJ SOLN
100.0000 ug | Freq: Once | INTRAMUSCULAR | Status: AC
  Administered 2020-03-26: 100 ug via INTRAMUSCULAR
  Filled 2020-03-26: qty 2

## 2020-03-26 MED ORDER — ACETAMINOPHEN 325 MG PO TABS
650.0000 mg | ORAL_TABLET | Freq: Four times a day (QID) | ORAL | Status: DC
  Administered 2020-03-26 (×2): 650 mg via ORAL
  Filled 2020-03-26 (×2): qty 2

## 2020-03-26 MED ORDER — METHOCARBAMOL 500 MG PO TABS
500.0000 mg | ORAL_TABLET | Freq: Four times a day (QID) | ORAL | Status: DC | PRN
  Administered 2020-03-26 (×2): 500 mg via ORAL
  Filled 2020-03-26 (×2): qty 1

## 2020-03-26 MED ORDER — GABAPENTIN 300 MG PO CAPS: 300.0000 mg | ORAL_CAPSULE | Freq: Three times a day (TID) | ORAL | Status: DC

## 2020-03-26 MED ORDER — OXYCODONE HCL 5 MG PO TABS
5.0000 mg | ORAL_TABLET | Freq: Four times a day (QID) | ORAL | 0 refills | Status: AC | PRN
Start: 2020-03-26 — End: ?

## 2020-03-26 MED ORDER — OXYCODONE HCL 5 MG PO TABS
5.0000 mg | ORAL_TABLET | ORAL | Status: DC | PRN
  Administered 2020-03-26 (×2): 5 mg via ORAL
  Filled 2020-03-26 (×2): qty 1

## 2020-03-26 MED ORDER — BACITRACIN ZINC 500 UNIT/GM EX OINT
TOPICAL_OINTMENT | Freq: Every day | CUTANEOUS | Status: DC
  Filled 2020-03-26: qty 28.35

## 2020-03-26 MED ORDER — ACETAMINOPHEN 500 MG PO TABS: 1000.0000 mg | ORAL_TABLET | Freq: Four times a day (QID) | ORAL | 0 refills | Status: AC | PRN

## 2020-03-26 MED ORDER — METHOCARBAMOL 500 MG PO TABS: 500.0000 mg | ORAL_TABLET | Freq: Three times a day (TID) | ORAL | 0 refills | Status: AC | PRN

## 2020-03-26 MED ORDER — FENTANYL CITRATE (PF) 100 MCG/2ML IJ SOLN: 100.0000 ug | Freq: Once | INTRAMUSCULAR | Status: DC

## 2020-03-26 MED ORDER — ONDANSETRON 4 MG PO TBDP: 4.0000 mg | ORAL_TABLET | Freq: Four times a day (QID) | ORAL | Status: DC | PRN

## 2020-03-26 MED ORDER — ACETAMINOPHEN 500 MG PO TABS
1000.0000 mg | ORAL_TABLET | Freq: Four times a day (QID) | ORAL | Status: DC
  Administered 2020-03-26: 1000 mg via ORAL
  Filled 2020-03-26: qty 2

## 2020-03-26 MED ORDER — ENOXAPARIN SODIUM 40 MG/0.4ML ~~LOC~~ SOLN: 40.0000 mg | SUBCUTANEOUS | Status: DC

## 2020-03-26 MED ORDER — SODIUM CHLORIDE 0.9% FLUSH
10.0000 mL | Freq: Two times a day (BID) | INTRAVENOUS | Status: DC
  Administered 2020-03-26: 10 mL

## 2020-03-26 MED ORDER — IBUPROFEN 800 MG PO TABS
800.0000 mg | ORAL_TABLET | Freq: Three times a day (TID) | ORAL | Status: DC
  Administered 2020-03-26: 800 mg via ORAL
  Filled 2020-03-26: qty 1

## 2020-03-26 MED ORDER — IBUPROFEN 800 MG PO TABS: 800.0000 mg | ORAL_TABLET | Freq: Three times a day (TID) | ORAL | 0 refills | Status: AC | PRN

## 2020-03-26 MED ORDER — SODIUM CHLORIDE 0.9% FLUSH: 10.0000 mL | INTRAVENOUS | Status: DC | PRN

## 2020-03-26 MED ORDER — ONDANSETRON HCL 4 MG/2ML IJ SOLN: 4.0000 mg | Freq: Four times a day (QID) | INTRAMUSCULAR | Status: DC | PRN

## 2020-03-26 MED ORDER — HYDROMORPHONE HCL 1 MG/ML IJ SOLN
0.5000 mg | INTRAMUSCULAR | Status: DC | PRN
  Administered 2020-03-26: 0.5 mg via INTRAVENOUS
  Filled 2020-03-26: qty 1
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
## 2020-03-26 NOTE — ED Notes (Signed)
Unsuccessful IV attempt x2.  
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
## 2020-03-26 NOTE — TOC CAGE-AID Note (Signed)
Transition of Care [facility]) - CAGE-AID Screening   Patient Details  Name: Allen Rodriguez MRN: [id number] Date of Birth: 07/02/1961  Transition of [facility]) CM/SW Contact:    Emeterio Reeve, [place] Phone Number: 03/26/2020, 12:09 PM   Clinical Narrative:  CSW met with pt at bedside. CSW introduced self and explained her role at the hospital.  Pt reports social alcohol use of less than monthly. Pt denies substance use. Pt did not need any resources at this time.    CAGE-AID Screening:    Have You Ever Felt You Ought to Cut Down on Your Drinking or Drug Use?: No Have People Annoyed You By Critizing Your Drinking Or Drug Use?: No Have You Felt Bad Or Guilty About Your Drinking Or Drug Use?: No Have You Ever Had a Drink or Used Drugs First Thing In The Morning to Steady Your Nerves or to Get Rid of a Hangover?: No CAGE-AID Score: 0  Substance Abuse Education Offered: Yes      Blima Ledger, [place] Social Worker [phone]

## 2020-03-26 NOTE — ED Notes (Signed)
Patient transported to CT 

## 2020-03-26 NOTE — Discharge Instructions (Signed)
Rib Fracture ° °A rib fracture is a break or crack in one of the bones of the ribs. The ribs are like a cage that goes around your upper chest. A broken or cracked rib is often painful, but most do not cause other problems. Most rib fractures usually heal on their own in 1-3 months. °Follow these instructions at home: °Managing pain, stiffness, and swelling °· If directed, apply ice to the injured area. °? Put ice in a plastic bag. °? Place a towel between your skin and the bag. °? Leave the ice on for 20 minutes, 2-3 times a day. °· Take over-the-counter and prescription medicines only as told by your doctor. °Activity °· Avoid activities that cause pain to the injured area. Protect your injured area. °· Slowly increase activity as told by your doctor. °General instructions °· Do deep breathing as told by your doctor. You may be told to: °? Take deep breaths many times a day. °? Cough many times a day while hugging a pillow. °? Use a device (incentive spirometer) to do deep breathing many times a day. °· Drink enough fluid to keep your pee (urine) clear or pale yellow. °· Do not wear a rib belt or binder. These do not allow you to breathe deeply. °· Keep all follow-up visits as told by your doctor. This is important. °Contact a doctor if: °· You have a fever. °Get help right away if: °· You have trouble breathing. °· You are short of breath. °· You cannot stop coughing. °· You cough up thick or bloody spit (sputum). °· You feel sick to your stomach (nauseous), throw up (vomit), or have belly (abdominal) pain. °· Your pain gets worse and medicine does not help. °Summary °· A rib fracture is a break or crack in one of the bones of the ribs. °· Apply ice to the injured area and take medicines for pain as told by your doctor. °· Take deep breaths and cough many times a day. Hug a pillow every time you cough. °This information is not intended to replace advice given to you by your health care provider. Make sure you  discuss any questions you have with your health care provider. °Document Revised: 07/01/2017 Document Reviewed: 10/19/2016 °Elsevier Patient Education © 2020 [employer] ° ° °Pneumothorax °A pneumothorax is commonly called a collapsed lung. It is a condition in which air leaks from a lung and builds up between the thin layer of tissue that covers the lungs (visceral pleura) and the interior wall of the chest cavity (parietal pleura). The air gets trapped outside the lung, between the lung and the chest wall (pleural space). The air takes up space and prevents the lung from fully expanding. °This condition sometimes occurs suddenly with no apparent cause. The buildup of air may be small or large. A small pneumothorax may go away on its own. A large pneumothorax will require treatment and hospitalization. °What are the causes? °This condition may be caused by: °· Trauma and injury to the chest wall. °· Surgery and other medical procedures. °· A complication of an underlying lung problem, especially chronic obstructive pulmonary disease (COPD) or emphysema. °Sometimes the cause of this condition is not known. °What increases the risk? °You are more likely to develop this condition if: °· You have an underlying lung problem. °· You smoke. °· You are 20-40 years old, male, tall, and underweight. °· You have a personal or family history of pneumothorax. °· You have an eating disorder (  anorexia nervosa). °This condition can also happen quickly, even in people with no history of lung problems. °What are the signs or symptoms? °Sometimes a pneumothorax will have no symptoms. When symptoms are present, they can include: °· Chest pain. °· Shortness of breath. °· Increased rate of breathing. °· Bluish color to your lips or skin (cyanosis). °How is this diagnosed? °This condition may be diagnosed by: °· A medical history and physical exam. °· A chest X-ray, chest CT scan, or ultrasound. °How is this treated? °Treatment depends  on how severe your condition is. The goal of treatment is to remove the extra air and allow your lung to expand back to its normal size. °· For a small pneumothorax: °? No treatment may be needed. °? Extra oxygen is sometimes used to make it go away more quickly. °· For a large pneumothorax or a pneumothorax that is causing symptoms, a procedure is done to drain the air from your lungs. To do this, a health care provider may use: °? A needle with a syringe. This is used to suck air from a pleural space where no additional leakage is taking place. °? A chest tube. This is used to suck air where there is ongoing leakage into the pleural space. The chest tube may need to remain in place for several days until the air leak has healed. °· In more severe cases, surgery may be needed to repair the damage that is causing the leak. °· If you have multiple pneumothorax episodes or have an air leak that will not heal, a procedure called a pleurodesis may be done. A medicine is placed in the pleural space to irritate the tissues around the lung so that the lung will stick to the chest wall, seal any leaks, and stop any buildup of air in that space. °If you have an underlying lung problem, severe symptoms, or a large pneumothorax you will usually need to stay in the hospital. °Follow these instructions at home: °Lifestyle °· Do not use any products that contain nicotine or tobacco, such as cigarettes and e-cigarettes. These are major risk factors in pneumothorax. If you need help quitting, ask your health care provider. °· Do not lift anything that is heavier than 10 lb (4.5 kg), or the limit that your health care provider tells you, until he or she says that it is safe. °· Avoid activities that take a lot of effort (strenuous) for as long as told by your health care provider. °· Return to your normal activities as told by your health care provider. Ask your health care provider what activities are safe for you. °· Do not fly in  an airplane or scuba dive until your health care provider says it is okay. °General instructions °· Take over-the-counter and prescription medicines only as told by your health care provider. °· If a cough or pain makes it difficult for you to sleep at night, try sleeping in a semi-upright position in a recliner or by using 2 or 3 pillows. °· If you had a chest tube and it was removed, ask your health care provider when you can remove the bandage (dressing). While the dressing is in place, do not allow it to get wet. °· Keep all follow-up visits as told by your health care provider. This is important. °Contact a health care provider if: °· You cough up thick mucus (sputum) that is yellow or green in color. °· You were treated with a chest tube, and you have redness,   increasing pain, or discharge at the site where it was placed. °Get help right away if: °· You have increasing chest pain or shortness of breath. °· You have a cough that will not go away. °· You begin coughing up blood. °· You have pain that is getting worse or is not controlled with medicines. °· The site where your chest tube was located opens up. °· You feel air coming out of the site where the chest tube was placed. °· You have a fever or persistent symptoms for more than 2-3 days. °· You have a fever and your symptoms suddenly get worse. °These symptoms may represent a serious problem that is an emergency. Do not wait to see if the symptoms will go away. Get medical help right away. Call your local emergency services (911 in the [place] Do not drive yourself to the hospital. °Summary °· A pneumothorax, commonly called a collapsed lung, is a condition in which air leaks from a lung and gets trapped between the lung and the chest wall (pleural space). °· The buildup of air may be small or large. A small pneumothorax may go away on its own. A large pneumothorax will require treatment and hospitalization. °· Treatment for this condition depends on how  severe the pneumothorax is. The goal of treatment is to remove the extra air and allow the lung to expand back to its normal size. °This information is not intended to replace advice given to you by your health care provider. Make sure you discuss any questions you have with your health care provider. °Document Revised: 07/01/2017 Document Reviewed: 06/27/2017 °Elsevier Patient Education © 2020 [employer] ° °

## 2020-03-26 NOTE — ED Notes (Signed)
Attempted report x1. 

## 2020-03-26 NOTE — Progress Notes (Signed)
Orthopedic Tech Progress Note Patient Details:  Allen Rodriguez 28-Apr-1961 [id number] RN called requesting CRUTCHES  Ortho Devices Type of Ortho Device: Crutches Ortho Device/Splint Interventions: Application   Post Interventions Patient Tolerated: Well Instructions Provided: Care of device   Janit Pagan 03/26/2020, 3:49 PM
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
## 2020-03-26 NOTE — Progress Notes (Signed)
Discharge instructions reviewed with patient. Patient able to answer teach back questions.  Printed AVS and work note given. Discharged to home via wheelchair. Accompanied by family member
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
## 2020-04-11 ENCOUNTER — Encounter: Payer: Self-pay | Admitting: Surgery

## 2020-04-22 ENCOUNTER — Ambulatory Visit: Payer: Managed Care, Other (non HMO) | Attending: General Surgery

## 2020-04-22 ENCOUNTER — Other Ambulatory Visit: Payer: Self-pay

## 2020-04-22 DIAGNOSIS — M25562 Pain in left knee: Secondary | ICD-10-CM

## 2020-04-22 DIAGNOSIS — R262 Difficulty in walking, not elsewhere classified: Secondary | ICD-10-CM

## 2020-04-22 DIAGNOSIS — M25652 Stiffness of left hip, not elsewhere classified: Secondary | ICD-10-CM | POA: Diagnosis present

## 2020-04-22 DIAGNOSIS — R0781 Pleurodynia: Secondary | ICD-10-CM

## 2020-04-22 DIAGNOSIS — M25552 Pain in left hip: Secondary | ICD-10-CM

## 2020-04-22 NOTE — Patient Instructions (Signed)
Trunk rotation and side bending , overhead reach x 10 reps 2-3x/day
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
## 2020-05-01 ENCOUNTER — Other Ambulatory Visit: Payer: Self-pay

## 2020-05-01 ENCOUNTER — Encounter: Payer: Self-pay | Admitting: Physical Therapy

## 2020-05-01 ENCOUNTER — Ambulatory Visit: Payer: Managed Care, Other (non HMO) | Admitting: Physical Therapy

## 2020-05-01 DIAGNOSIS — R0781 Pleurodynia: Secondary | ICD-10-CM

## 2020-05-01 DIAGNOSIS — M25652 Stiffness of left hip, not elsewhere classified: Secondary | ICD-10-CM

## 2020-05-01 DIAGNOSIS — M25552 Pain in left hip: Secondary | ICD-10-CM

## 2020-05-01 DIAGNOSIS — R262 Difficulty in walking, not elsewhere classified: Secondary | ICD-10-CM

## 2020-05-01 DIAGNOSIS — M25562 Pain in left knee: Secondary | ICD-10-CM

## 2020-05-01 NOTE — Patient Instructions (Addendum)

## 2020-05-01 NOTE — Therapy (Signed)
[place], [place], [address] Phone: [phone]   Fax:  [phone]  Physical Therapy Treatment  Patient Details  Name: Allen Rodriguez MRN: [id number] Date of Birth: [birth year] Referring Provider (PT): Obie Dredge, [place]   Encounter Date: 05/01/2020   PT End of Session - 05/01/20 0837    Visit Number 2    Number of Visits 16    Date for PT Re-Evaluation 06/06/20    Authorization Type [employer]    PT Start Time 0802    PT Stop Time 0845    PT Time Calculation (min) 43 min    Activity Tolerance Patient tolerated treatment well    Behavior During Therapy [facility] for tasks assessed/performed           Past Medical History:  Diagnosis Date   High cholesterol    Hyperlipidemia    Mucoid cyst of joint    left small finger    Past Surgical History:  Procedure Laterality Date   HERNIA REPAIR     MASS EXCISION Left 04/24/2015   Procedure: LEFT SMALL FINGER  EXCISION MASS;  Surgeon: Leanora Cover, MD;  Location: [place];  Service: Orthopedics;  Laterality: Left;   SHOULDER SURGERY  29/52   left   UMBILICAL HERNIA REPAIR  02/25/12    There were no vitals filed for this visit.   Subjective Assessment - 05/01/20 0805    Subjective Returns today  when I cough or sneeze i have a dull ache instead of a sharp pain. I am starting to feel better    Pertinent History use to walk and run .  walk mile yesterday.  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 Yes    Pain Score 4     Pain Location Knee    Pain Orientation Left    Pain Descriptors / Indicators Throbbing    Pain Type Acute pain   august 24th   Pain Onset More than a month ago    Pain Frequency Intermittent    Multiple Pain Sites Yes    Pain Score 5    Pain Location Hip    Pain Orientation Left    Pain Descriptors / Indicators  Aching    Pain Type Acute pain    Pain Onset More than a month ago    Pain Frequency Intermittent                             [facility] Adult PT Treatment/Exercise - 05/01/20 0001      Lumbar Exercises: Stretches   Lower Trunk Rotation 5 reps;20 seconds    Lower Trunk Rotation Limitations R and L    Other Lumbar Stretch Exercise sidelying book opening 10 x to RT and LT    Other Lumbar Stretch Exercise sitting side bend with overhead reach 10 x each side      Knee/Hip Exercises: Stretches   Active Hamstring Stretch 3 reps;Left;Right;30 seconds      Knee/Hip Exercises: Standing   Wall Squat 10 seconds;10 reps    Other Standing Knee Exercises side stepping with green t band 10 feet x 5 Rt and LT  revers lunge to ther ex mat 5 x on Rt and LT    Other Standing Knee Exercises squat with chair  touch 3 x 10 with added cushion      Modalities   Modalities Iontophoresis      Iontophoresis   Type of Iontophoresis Dexamethasone    Location knee   medial   Dose 1cc patch    Time 4-6 hours and then remove                  PT Education - 05/01/20 0836    Education Details added strength and stretching to HEP  education on iontophoresisi    Person(s) Educated Patient    Methods Explanation;Demonstration;Tactile cues;Verbal cues;Handout    Comprehension Verbalized understanding;Returned demonstration            PT Short Term Goals - 04/22/20 0925      PT SHORT TERM GOAL #1   Title He will be independent with intial HEp    Time 3    Period Weeks    Status New      PT SHORT TERM GOAL #2   Title He will walk 1.5 to 2 miles without increased pain in Lt leg    Time 4    Period Weeks    Status New      PT SHORT TERM GOAL #3   Title He will report medial Lt knee pain deecreased 50%    Time 4    Period Weeks    Status New             PT Long Term Goals - 04/22/20 2706      PT LONG TERM GOAL #1   Title He will be independent with all HEP issued     Time 8    Period Weeks    Status New      PT LONG TERM GOAL #2   Title He will report walking for 2 miles with no hip or knee pain    Time 8    Period Weeks    Status New      PT LONG TERM GOAL #3   Title He will report chest pain 1-2 max with use of UE and trunk for yard and home tasks    Time 8    Period Weeks    Status New      PT LONG TERM GOAL #4   Title He will be able to work a full day    Time 8    Period Weeks    Status New      PT LONG TERM GOAL #5   Title FOTO score will improve to 66%    Time 8    Period Weeks    Status New          Added to HEP  Access Code: [email or web address]: [email or web address]: 09/30/2021Prepared by: Donnetta Simpers BeardsleyExercises  Standing Hamstring Stretch on Chair - 1 x daily - 7 x weekly - 1 sets - 3 reps - 20-30 hold  Seated Sidebending Arms Overhead - 1 x daily - 7 x weekly - 1 sets - 10 reps  Supine Lower Trunk Rotation - 1 x daily - 7 x weekly - 1 sets - 5 reps - 20 hold  Sidelying Open Book Thoracic Lumbar Rotation and Extension - 1 x daily - 7 x weekly - 3 sets - 10 reps  Squat with Chair Touch - 1 x daily - 7 x weekly - 3 sets - 10 reps  Side Stepping with Resistance at Feet - 1 x daily - 7 x weekly - 2 sets - 5-10  reps  Wall Squat - 1 x daily - 7 x weekly - 1 sets - 10 reps - 10-30 to max 60 second x 2 hold       Plan - 05/01/20 0956    Clinical Impression Statement Pt returns to clinic for 2nd visit with pain inmedial LT knee and recieved iontophoresis patch for inflammation and pain.  strengthening HEP given today and pt returned demo of all exercises.  He is responding well to exercise from eval. Will continue POC    Personal Factors and Comorbidities Time since onset of injury/illness/exacerbation;Fitness;Profession    Examination-Activity Limitations Reach Overhead;Sit;Squat;Stairs;Lift;Locomotion Level    Examination-Participation Restrictions Cleaning;Occupation;Community Activity;Yard Work    PT Frequency  2x / week    PT Duration 8 weeks    PT Treatment/Interventions Passive range of motion;Manual techniques;Therapeutic exercise;Therapeutic activities;Iontophoresis 4mg /ml Dexamethasone;Electrical Stimulation;Moist Heat;Cryotherapy;Taping    PT Next Visit Plan review HEP and progress strength and activity strength , ionto to medial knee andmodalites as needed for pain reverse lunge    PT Home Exercise Plan reaching overhead , trunk rotation and sidebending  [id number]  standing hamstring, thoracic sidelying stretch,  side bend with OH reach. moster lateral walk green t band and wall squat    Consulted and Agree with Plan of Care Patient           Patient will benefit from skilled therapeutic intervention in order to improve the following deficits and impairments:  Pain, Decreased activity tolerance, Decreased range of motion  Visit Diagnosis: Rib pain  Acute pain of left knee  Pain in left hip  Stiffness of left hip joint  Difficulty in walking, not elsewhere classified     Problem List Patient Active Problem List   Diagnosis Date Noted   Blunt trauma 75/88/3254   Supraumbilical hernia 98/26/4158    Voncille Lo, PT, [place] Certified Exercise Expert for the Aging Adult  05/01/20 10:00 AM Phone: [phone] Fax: [facility] [address] [place], [place], [address] Phone: [phone]   Fax:  [phone]  Name: Allen Rodriguez MRN: [id number] Date of Birth: March 16, 1961

## 2020-05-07 ENCOUNTER — Other Ambulatory Visit: Payer: Self-pay

## 2020-05-07 ENCOUNTER — Ambulatory Visit: Payer: Managed Care, Other (non HMO) | Attending: General Surgery | Admitting: Physical Therapy

## 2020-05-07 ENCOUNTER — Encounter: Payer: Self-pay | Admitting: Physical Therapy

## 2020-05-07 DIAGNOSIS — R0781 Pleurodynia: Secondary | ICD-10-CM

## 2020-05-07 DIAGNOSIS — M25652 Stiffness of left hip, not elsewhere classified: Secondary | ICD-10-CM | POA: Diagnosis present

## 2020-05-07 DIAGNOSIS — R262 Difficulty in walking, not elsewhere classified: Secondary | ICD-10-CM | POA: Diagnosis present

## 2020-05-07 DIAGNOSIS — M25552 Pain in left hip: Secondary | ICD-10-CM | POA: Diagnosis present

## 2020-05-07 DIAGNOSIS — M25562 Pain in left knee: Secondary | ICD-10-CM

## 2020-05-07 NOTE — Therapy (Signed)
[place], [place], [address] Phone: [phone]   Fax:  [phone]  Physical Therapy Treatment  Patient Details  Name: Allen Rodriguez MRN: [id number] Date of Birth: July 17, 1961 Referring Provider (PT): Obie Dredge, [place]   Encounter Date: 05/07/2020   PT End of Session - 05/07/20 0801    Visit Number 3    Number of Visits 16    Date for PT Re-Evaluation 06/06/20    Authorization Type [employer]ign[employer]    PT Start Time 0800    PT Stop Time 0856    PT Time Calculation (min) 56 min    Activity Tolerance Patient tolerated treatment well    Behavior During Therapy [facility] for tasks assessed/performed           Past Medical History:  Diagnosis Date  . High cholesterol   . Hyperlipidemia   . Mucoid cyst of joint    left small finger    Past Surgical History:  Procedure Laterality Date  . HERNIA REPAIR    . MASS EXCISION Left 04/24/2015   Procedure: LEFT SMALL FINGER  EXCISION MASS;  Surgeon: Leanora Cover, MD;  Location: [place];  Service: Orthopedics;  Laterality: Left;  . SHOULDER SURGERY  09/12   left  . UMBILICAL HERNIA REPAIR  02/25/12    There were no vitals filed for this visit.   Subjective Assessment - 05/07/20 0802    Subjective Ionto patch really helped and rib pain has felt better, I have gone from a sharp to a dull pain. 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  walk mile yesterday.  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 Yes    Pain Score 3     Pain Location Knee    Pain Orientation Left    Pain Descriptors / Indicators Sore    Pain Type Acute pain    Pain Onset More than a month ago    Pain Frequency Intermittent    Pain Score 4    Pain Location  Hip    Pain Orientation Left    Pain Descriptors / Indicators Throbbing    Pain Type Acute pain    Pain Onset More than a month ago                             [facility] Adult PT Treatment/Exercise - 05/07/20 0001      Lumbar Exercises: Stretches   Lower Trunk Rotation 5 reps;20 seconds    Lower Trunk Rotation Limitations R and L    Other Lumbar Stretch Exercise sitting side bend with overhead reach 10 x each side      Knee/Hip Exercises: Stretches   Active Hamstring Stretch 3 reps;Left;Right;30 seconds    Piriformis Stretch 4 reps;30 seconds    Piriformis Stretch Limitations sitting x 2 on LT and supine x 2 on LT      Knee/Hip Exercises: Standing   Lateral Step Up Hand Hold: 2;Step Height: 8";2 sets;10 reps;Left    Lateral Step Up Limitations VC and TC    Forward Step Up Hand Hold: 2;Step Height: 8";2  sets;10 reps    Forward Step Up Limitations VC and TC    Wall Squat 10 seconds;10 reps    Other Standing Knee Exercises lunge with overhead press with 15 # on RT and LT and then swithing legs and RT and LT all 10 x each      Modalities   Modalities Iontophoresis      Moist Heat Therapy   Number Minutes Moist Heat 12 Minutes    Moist Heat Location Hip   LT     Iontophoresis   Type of Iontophoresis Dexamethasone    Location knee   medial   Dose 1cc patch    Time 4-6 hours and then remove      Manual Therapy   Manual Therapy Soft tissue mobilization    Manual therapy comments skilled palpation for TPDN    Soft tissue mobilization LT pirifomis with IASTYM tool             Trigger Point Dry Needling - 05/07/20 0001    Consent Given? Yes    Education Handout Provided Yes    Muscles Treated Back/Hip Piriformis   LT   Dry Needling Comments 100 mm .40 gage     Piriformis Response Twitch response elicited;Palpable increased muscle length                PT Education - 05/07/20 0823    Education Details Added to HEP standing step ups and piriformis  stretch and education on TPDN after care and precautians    Person(s) Educated Patient    Methods Explanation;Demonstration;Tactile cues;Verbal cues;Handout    Comprehension Verbalized understanding;Returned demonstration            PT Short Term Goals - 04/22/20 0925      PT SHORT TERM GOAL #1   Title He will be independent with intial HEp    Time 3    Period Weeks    Status New      PT SHORT TERM GOAL #2   Title He will walk 1.5 to 2 miles without increased pain in Lt leg    Time 4    Period Weeks    Status New      PT SHORT TERM GOAL #3   Title He will report medial Lt knee pain deecreased 50%    Time 4    Period Weeks    Status New             PT Long Term Goals - 04/22/20 5809      PT LONG TERM GOAL #1   Title He will be independent with all HEP issued    Time 8    Period Weeks    Status New      PT LONG TERM GOAL #2   Title He will report walking for 2 miles with no hip or knee pain    Time 8    Period Weeks    Status New      PT LONG TERM GOAL #3   Title He will report chest pain 1-2 max with use of UE and trunk for yard and home tasks    Time 8    Period Weeks    Status New      PT LONG TERM GOAL #4   Title He will be able to work a full day    Time 8    Period Weeks    Status New      PT LONG TERM GOAL #5  Title FOTO score will improve to 66%    Time 8    Period Weeks    Status New          Added to HEP  Access Code: [email or web address]: [email or web address]: 10/06/2021Prepared by: Donnetta Simpers BeardsleyExercises    Runner's Step up with Arms Forward - 1 x daily - 7 x weekly - 3 sets - 10 reps  Lateral Step Up - 1 x daily - 7 x weekly - 3 sets - 10 reps  Seated Figure 4 Piriformis Stretch - 2 x daily - 7 x weekly - 1 sets - 3 reps - 20-30 hold  Supine Piriformis Stretch with Leg Straight - 2 x daily - 7 x weekly - 1 sets - 3 reps - 20-30 hold        Plan - 05/07/20 0807    Clinical Impression Statement Allen Rodriguez returns  to clinic with decreased pain in LT hip and LT knee.  Pt with iontophoresis patch to LT medial knee ( MCL strain) and wit LT hip pain over piriformis.  Pt with difficulty stretching piriformis but consented to TPDN and was closely monitored  Allen Rodriguez was able to stretch after TPDN piriformis with 505 greater ease and greater AROM. continuing to strengthen LE/ hips and knees and added to HEP   Cont POC    Personal Factors and Comorbidities Time since onset of injury/illness/exacerbation;Fitness;Profession    Examination-Activity Limitations Reach Overhead;Sit;Squat;Stairs;Lift;Locomotion Level    Examination-Participation Restrictions Cleaning;Occupation;Community Activity;Yard Work    PT Treatment/Interventions Passive range of motion;Manual techniques;Therapeutic exercise;Therapeutic activities;Iontophoresis 4mg /ml Dexamethasone;Electrical Stimulation;Moist Heat;Cryotherapy;Taping    PT Next Visit Plan review HEP and progress strength and activity strength , ionto to medial knee if needed   reverse lunge/ knee hip strength Manual joint mobs of LT hip as needed .    PT Home Exercise Plan reaching overhead , trunk rotation and sidebending  [id number]  standing hamstring, thoracic sidelying stretch,  side bend with OH reach. moster lateral walk green t band and wall squat    Consulted and Agree with Plan of Care Patient           Patient will benefit from skilled therapeutic intervention in order to improve the following deficits and impairments:  Pain, Decreased activity tolerance, Decreased range of motion  Visit Diagnosis: Rib pain  Acute pain of left knee  Pain in left hip  Stiffness of left hip joint  Difficulty in walking, not elsewhere classified     Problem List Patient Active Problem List   Diagnosis Date Noted  . Blunt trauma 03/26/2020  . Supraumbilical hernia 74/03/1447    Voncille Lo, PT, [place] Certified Exercise Expert for the Aging Adult  05/07/20 8:48  AM Phone: [phone] Fax: [phone]  [facility] [address] [place], [place], [address] Phone: [phone]   Fax:  [phone]  Name: Allen Rodriguez MRN: [id number] Date of Birth: 10-10-1960

## 2020-05-07 NOTE — Patient Instructions (Addendum)
Trigger Point Dry Needling   What is Trigger Point Dry Needling (DN)? o DN is a physical therapy technique used to treat muscle pain and dysfunction. Specifically, DN helps deactivate muscle trigger points (muscle knots).  o A thin filiform needle is used to penetrate the skin and stimulate the underlying trigger point. The goal is for a local twitch response (LTR) to occur and for the trigger point to relax. No medication of any kind is injected during the procedure.    What Does Trigger Point Dry Needling Feel Like?  o The procedure feels different for each individual patient. Some patients report that they do not actually feel the needle enter the skin and overall the process is not painful. Very mild bleeding may occur. However, many patients feel a deep cramping in the muscle in which the needle was inserted. This is the local twitch response.    How Will I feel after the treatment? o Soreness is normal, and the onset of soreness may not occur for a few hours. Typically this soreness does not last longer than two days.  o Bruising is uncommon, however; ice can be used to decrease any possible bruising.  o In rare cases feeling tired or nauseous after the treatment is normal. In addition, your symptoms may get worse before they get better, this period will typically not last longer than 24 hours.    What Can I do After My Treatment? o Increase your hydration by drinking more water for the next 24 hours. o You may place ice or heat on the areas treated that have become sore, however, do not use heat on inflamed or bruised areas. Heat often brings more relief post needling. o You can continue your regular activities, but vigorous activity is not recommended initially after the treatment for 24 hours. o DN is best combined with other physical therapy such as strengthening, stretching, and other therapies.          Voncille Lo, PT, [place] Certified Exercise Expert for the Aging  Adult  05/07/20 8:06 AM Phone: [phone] Fax: [phone]

## 2020-05-09 ENCOUNTER — Encounter: Payer: Self-pay | Admitting: Physical Therapy

## 2020-05-09 ENCOUNTER — Ambulatory Visit: Payer: Managed Care, Other (non HMO) | Admitting: Physical Therapy

## 2020-05-09 ENCOUNTER — Other Ambulatory Visit: Payer: Self-pay

## 2020-05-09 DIAGNOSIS — M25652 Stiffness of left hip, not elsewhere classified: Secondary | ICD-10-CM

## 2020-05-09 DIAGNOSIS — R262 Difficulty in walking, not elsewhere classified: Secondary | ICD-10-CM

## 2020-05-09 DIAGNOSIS — M25562 Pain in left knee: Secondary | ICD-10-CM

## 2020-05-09 DIAGNOSIS — M25552 Pain in left hip: Secondary | ICD-10-CM

## 2020-05-09 DIAGNOSIS — R0781 Pleurodynia: Secondary | ICD-10-CM

## 2020-05-09 NOTE — Therapy (Signed)
[place], [place], [address] Phone: [phone]   Fax:  [phone]  Physical Therapy Treatment  Patient Details  Name: Allen Rodriguez MRN: [id number] Date of Birth: 1961/02/17 Referring Provider (PT): Obie Dredge, [place]   Encounter Date: 05/09/2020   PT End of Session - 05/09/20 0840    Visit Number 4    Number of Visits 16    Date for PT Re-Evaluation 06/06/20    Authorization Type Cigna    PT Start Time 0802    PT Stop Time 0840    PT Time Calculation (min) 38 min    Activity Tolerance Patient tolerated treatment well    Behavior During Therapy [facility] for tasks assessed/performed           Past Medical History:  Diagnosis Date  . High cholesterol   . Hyperlipidemia   . Mucoid cyst of joint    left small finger    Past Surgical History:  Procedure Laterality Date  . HERNIA REPAIR    . MASS EXCISION Left 04/24/2015   Procedure: LEFT SMALL FINGER  EXCISION MASS;  Surgeon: Leanora Cover, MD;  Location: [place];  Service: Orthopedics;  Laterality: Left;  . SHOULDER SURGERY  09/12   left  . UMBILICAL HERNIA REPAIR  02/25/12    There were no vitals filed for this visit.   Subjective Assessment - 05/09/20 0804    Subjective Ribs are feeling good. Hip still feels like it is catching sometimes when I stand after sitting for a while and when I walk. Knee is stiff in the AM.Reports significant pain and instability in Left ankle.    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              [facility] PT Assessment - 05/09/20 0001      Palpation   SI assessment  Lt post illium in supine resulting in functional LLD- Lt longer                         [facility] Adult PT Treatment/Exercise - 05/09/20 0001      Exercises   Exercises Knee/Hip      Lumbar Exercises: Stretches   Other Lumbar Stretch Exercise  supine self correction for post Lt illium- Hesh 2x2 min      Knee/Hip Exercises: Stretches   Gastroc Stretch Limitations long sitting gastroc stretch      Knee/Hip Exercises: Supine   Bridges with Ball Squeeze Both;15 reps   cues for abdominal activation   Other Supine Knee/Hip Exercises post pelvic tilt- cues to reduce cervical tension    Other Supine Knee/Hip Exercises TT lifts with ball bw knees      Manual Therapy   Manual Therapy Joint mobilization    Joint Mobilization end range DF talocrural AP                  PT Education - 05/09/20 0845    Education Details anatomy of condition    Person(s) Educated Patient    Methods Explanation    Comprehension Verbalized understanding;Need further instruction            PT Short Term Goals - 04/22/20 0925      PT SHORT TERM GOAL #1   Title He will be independent with intial HEp    Time 3  Period Weeks    Status New      PT SHORT TERM GOAL #2   Title He will walk 1.5 to 2 miles without increased pain in Lt leg    Time 4    Period Weeks    Status New      PT SHORT TERM GOAL #3   Title He will report medial Lt knee pain deecreased 50%    Time 4    Period Weeks    Status New             PT Long Term Goals - 04/22/20 1610      PT LONG TERM GOAL #1   Title He will be independent with all HEP issued    Time 8    Period Weeks    Status New      PT LONG TERM GOAL #2   Title He will report walking for 2 miles with no hip or knee pain    Time 8    Period Weeks    Status New      PT LONG TERM GOAL #3   Title He will report chest pain 1-2 max with use of UE and trunk for yard and home tasks    Time 8    Period Weeks    Status New      PT LONG TERM GOAL #4   Title He will be able to work a full day    Time 8    Period Weeks    Status New      PT LONG TERM GOAL #5   Title FOTO score will improve to 66%    Time 8    Period Weeks    Status New                 Plan - 05/09/20 9604     Clinical Impression Statement Notable post illium corrected with stretch and reported decreased ankle symptoms with cavitations in hip after each stretch- denied pain. Supination in ankle bilaterally in swing through with limited DF flexibility (+2 passively). edema noted post to Lt medial malleolus.    PT Treatment/Interventions Passive range of motion;Manual techniques;Therapeutic exercise;Therapeutic activities;Iontophoresis 4mg /ml Dexamethasone;Electrical Stimulation;Moist Heat;Cryotherapy;Taping    PT Next Visit Plan recheck pelvic rotation, Lt ankle mobilization/stretching, central stability    PT Home Exercise Plan reaching overhead , trunk rotation and sidebending  [id number]  standing hamstring, thoracic sidelying stretch,  side bend with OH reach. moster lateral walk green t band and wall squat    Consulted and Agree with Plan of Care Patient           Patient will benefit from skilled therapeutic intervention in order to improve the following deficits and impairments:  Pain, Decreased activity tolerance, Decreased range of motion  Visit Diagnosis: Rib pain  Acute pain of left knee  Pain in left hip  Stiffness of left hip joint  Difficulty in walking, not elsewhere classified     Problem List Patient Active Problem List   Diagnosis Date Noted  . Blunt trauma 03/26/2020  . Supraumbilical hernia 54/04/8118    Armel Rabbani C. Vonnie Ligman PT, DPT 05/09/20 8:45 AM   [facility] [address] [place], [place], [address] Phone: [phone]   Fax:  [phone]  Name: Allen Rodriguez MRN: [id number] Date of Birth: [birth year]

## 2020-05-13 ENCOUNTER — Encounter: Payer: Self-pay | Admitting: Physical Therapy

## 2020-05-13 ENCOUNTER — Ambulatory Visit: Payer: Managed Care, Other (non HMO) | Admitting: Physical Therapy

## 2020-05-13 ENCOUNTER — Other Ambulatory Visit: Payer: Self-pay

## 2020-05-13 DIAGNOSIS — R0781 Pleurodynia: Secondary | ICD-10-CM

## 2020-05-13 DIAGNOSIS — R262 Difficulty in walking, not elsewhere classified: Secondary | ICD-10-CM

## 2020-05-13 DIAGNOSIS — M25562 Pain in left knee: Secondary | ICD-10-CM

## 2020-05-13 DIAGNOSIS — M25552 Pain in left hip: Secondary | ICD-10-CM

## 2020-05-13 DIAGNOSIS — M25652 Stiffness of left hip, not elsewhere classified: Secondary | ICD-10-CM

## 2020-05-13 NOTE — Patient Instructions (Addendum)
    Voncille Lo, PT, [place] Certified Exercise Expert for the Aging Adult  05/13/20 8:13 AM Phone: [phone] Fax: [phone]

## 2020-05-13 NOTE — Therapy (Signed)
[place], [place], [address] Phone: [phone]   Fax:  [phone]  Physical Therapy Treatment  Patient Details  Name: Allen Rodriguez MRN: [id number] Date of Birth: Oct 04, 1960 Referring Provider (PT): Obie Dredge, [place]   Encounter Date: 05/13/2020   PT End of Session - 05/13/20 0801    Visit Number 5    Number of Visits 16    Date for PT Re-Evaluation 06/06/20    Authorization Type [employer]    PT Start Time 0801    PT Stop Time 0847    PT Time Calculation (min) 46 min    Activity Tolerance Patient tolerated treatment well    Behavior During Therapy [facility] for tasks assessed/performed           Past Medical History:  Diagnosis Date  . High cholesterol   . Hyperlipidemia   . Mucoid cyst of joint    left small finger    Past Surgical History:  Procedure Laterality Date  . HERNIA REPAIR    . MASS EXCISION Left 04/24/2015   Procedure: LEFT SMALL FINGER  EXCISION MASS;  Surgeon: Leanora Cover, MD;  Location: [place];  Service: Orthopedics;  Laterality: Left;  . SHOULDER SURGERY  09/12   left  . UMBILICAL HERNIA REPAIR  02/25/12    There were no vitals filed for this visit.   Subjective Assessment - 05/13/20 0805    Subjective Ribs are fine and no pain, I feel a pull on step ups but not when I am walking    Currently in Pain? Yes    Pain Score 3    up the steps   Pain Location Knee    Pain Orientation Left    Pain Descriptors / Indicators Aching    Pain Score 3    Pain Location Hip    Pain Orientation Left    Pain Descriptors / Indicators Aching    Pain Type Acute pain    Pain Onset More than a month ago                             [facility] Adult PT Treatment/Exercise - 05/13/20 0001      Exercises   Exercises Knee/Hip      Lumbar Exercises: Stretches   Other Lumbar Stretch Exercise supine self correction for post Lt illium- Hesh 2x2 min   post TPDN      Knee/Hip Exercises: Stretches   Gastroc Stretch Limitations long sitting gastroc stretch   with strap     Knee/Hip Exercises: Standing   Forward Step Up 5 reps    Forward Step Up Limitations on chair seat in room with knee medial lean      Knee/Hip Exercises: Supine   Bridges with [employer] Both;10 reps;2 sets   cues for abdominal activation   Other Supine Knee/Hip Exercises TT lifts with ball bw knees      Modalities   Modalities Iontophoresis      Moist Heat Therapy   Number Minutes Moist Heat 12 Minutes    Moist Heat Location Hip   LT concurrent with ankle mobs in prone     Iontophoresis   Type of Iontophoresis Dexamethasone    Location ankle     LEft only medial  submalleolus    Dose 1cc patch    Time 4-6 hours and then remove      Manual Therapy  Manual Therapy Joint mobilization    Manual therapy comments skilled palpation for TPDN    Joint Mobilization end range DF talocrural PA  as well as lateral mobs      Soft tissue mobilization lt iliacus/flexor LT            Trigger Point Dry Needling - 05/13/20 0001    Muscles Treated Back/Hip Iliacus   left only   Dry Needling Comments 50 mm .30 gage    Iliacus Response Twitch response elicited;Palpable increased muscle length                PT Education - 05/13/20 0832    Education Details added hip strength to HEP,            PT Short Term Goals - 04/22/20 0925      PT SHORT TERM GOAL #1   Title He will be independent with intial HEp    Time 3    Period Weeks    Status New      PT SHORT TERM GOAL #2   Title He will walk 1.5 to 2 miles without increased pain in Lt leg    Time 4    Period Weeks    Status New      PT SHORT TERM GOAL #3   Title He will report medial Lt knee pain deecreased 50%    Time 4    Period Weeks    Status New             PT Long Term Goals - 04/22/20 3299      PT LONG TERM GOAL #1   Title He will be independent with all HEP issued    Time 8    Period  Weeks    Status New      PT LONG TERM GOAL #2   Title He will report walking for 2 miles with no hip or knee pain    Time 8    Period Weeks    Status New      PT LONG TERM GOAL #3   Title He will report chest pain 1-2 max with use of UE and trunk for yard and home tasks    Time 8    Period Weeks    Status New      PT LONG TERM GOAL #4   Title He will be able to work a full day    Time 8    Period Weeks    Status New      PT LONG TERM GOAL #5   Title FOTO score will improve to 66%    Time 8    Period Weeks    Status New                 Plan - 05/13/20 0810    Clinical Impression Statement minimal post illium corrected with stretch and post TPDN of LT iliacus.  Pt with decreased pain post TPDN after consent to Rx and was closely monitored throughout session.  Pt added to HEP for hip/core stability with green t band.  Pt with decreased pain with hip flexor motion post TX . Pt medial ankle with slight swelling and given Iontophoresis for inflammation.  Will cont POC    Personal Factors and Comorbidities Time since onset of injury/illness/exacerbation;Fitness;Profession    Examination-Activity Limitations Reach Overhead;Sit;Squat;Stairs;Lift;Locomotion Level    Examination-Participation Restrictions Cleaning;Occupation;Community Activity;Yard Work    PT Treatment/Interventions Passive range of motion;Manual techniques;Therapeutic exercise;Therapeutic activities;Iontophoresis 4mg /ml  Dexamethasone;Electrical Stimulation;Moist Heat;Cryotherapy;Taping    PT Next Visit Plan recheck pelvic rotation, Lt ankle mobilization/stretching, central stability CHEck goals foto    PT Home Exercise Plan reaching overhead , trunk rotation and sidebending  [id number]  standing hamstring, thoracic sidelying stretch,  side bend with OH reach. moster lateral walk green t band and wall squat    Consulted and Agree with Plan of Care Patient           Patient will benefit from skilled therapeutic  intervention in order to improve the following deficits and impairments:  Pain, Decreased activity tolerance, Decreased range of motion  Visit Diagnosis: Rib pain  Acute pain of left knee  Pain in left hip  Stiffness of left hip joint  Difficulty in walking, not elsewhere classified     Problem List Patient Active Problem List   Diagnosis Date Noted  . Blunt trauma 03/26/2020  . Supraumbilical hernia 40/05/2724   Voncille Lo, PT, [place] Certified Exercise Expert for the Aging Adult  05/13/20 8:52 AM Phone: [phone] Fax: [phone]  [facility] [address] [place], [place], [address] Phone: [phone]   Fax:  [phone]  Name: Allen Rodriguez MRN: [id number] Date of Birth: 1961/03/25

## 2020-05-16 ENCOUNTER — Other Ambulatory Visit: Payer: Self-pay

## 2020-05-16 ENCOUNTER — Encounter: Payer: Self-pay | Admitting: Physical Therapy

## 2020-05-16 ENCOUNTER — Ambulatory Visit: Payer: Managed Care, Other (non HMO) | Admitting: Physical Therapy

## 2020-05-16 DIAGNOSIS — M25652 Stiffness of left hip, not elsewhere classified: Secondary | ICD-10-CM

## 2020-05-16 DIAGNOSIS — M25552 Pain in left hip: Secondary | ICD-10-CM

## 2020-05-16 DIAGNOSIS — R0781 Pleurodynia: Secondary | ICD-10-CM | POA: Diagnosis not present

## 2020-05-16 DIAGNOSIS — R262 Difficulty in walking, not elsewhere classified: Secondary | ICD-10-CM

## 2020-05-16 DIAGNOSIS — M25562 Pain in left knee: Secondary | ICD-10-CM

## 2020-05-16 NOTE — Therapy (Signed)
[place], [place], [address] Phone: [phone]   Fax:  [phone]  Physical Therapy Treatment  Patient Details  Name: JOSHAU CODE MRN: [id number] Date of Birth: 03/21/1961 Referring Provider (PT): Obie Dredge, [place]   Encounter Date: 05/16/2020   PT End of Session - 05/16/20 0800    Visit Number 6    Number of Visits 16    Date for PT Re-Evaluation 06/06/20    Authorization Type [employer]    PT Start Time 0800    PT Stop Time 0841    PT Time Calculation (min) 41 min    Activity Tolerance Patient tolerated treatment well    Behavior During Therapy [facility] for tasks assessed/performed           Past Medical History:  Diagnosis Date  . High cholesterol   . Hyperlipidemia   . Mucoid cyst of joint    left small finger    Past Surgical History:  Procedure Laterality Date  . HERNIA REPAIR    . MASS EXCISION Left 04/24/2015   Procedure: LEFT SMALL FINGER  EXCISION MASS;  Surgeon: Leanora Cover, MD;  Location: [place];  Service: Orthopedics;  Laterality: Left;  . SHOULDER SURGERY  09/12   left  . UMBILICAL HERNIA REPAIR  02/25/12    There were no vitals filed for this visit.   Subjective Assessment - 05/16/20 0802    Subjective I felt the groin for a few days after DN. Stiff and discomfort rather than pain. stretching adductors creates a pull in groin as well.    Currently in Pain? No/denies                             [facility] Adult PT Treatment/Exercise - 05/16/20 0001      Lumbar Exercises: Stretches   Other Lumbar Stretch Exercise supine self correction Lt post illium Hesh      Knee/Hip Exercises: Standing   Other Standing Knee Exercises sumo squats blue tband around knees      Knee/Hip Exercises: Seated   Sit to Sand 10 reps;without UE support   ball bw knees     Knee/Hip Exercises: Supine   Bridges with [employer] Both;15 reps   cues for core  engagement   Other Supine Knee/Hip Exercises TT to v-outs ball bw knees pilates ring in hands      Iontophoresis   Type of Iontophoresis Dexamethasone    Location ankle      Dose 1cc patch    Time --   6 hr wear     Manual Therapy   Joint Mobilization Lt FA AP gr 4    Soft tissue mobilization roller Lt HS                    PT Short Term Goals - 04/22/20 6387      PT SHORT TERM GOAL #1   Title He will be independent with intial HEp    Time 3    Period Weeks    Status New      PT SHORT TERM GOAL #2   Title He will walk 1.5 to 2 miles without increased pain in Lt leg    Time 4    Period Weeks    Status New      PT SHORT TERM GOAL #3   Title He will report medial Lt knee pain deecreased  50%    Time 4    Period Weeks    Status New             PT Long Term Goals - 04/22/20 0929      PT LONG TERM GOAL #1   Title He will be independent with all HEP issued    Time 8    Period Weeks    Status New      PT LONG TERM GOAL #2   Title He will report walking for 2 miles with no hip or knee pain    Time 8    Period Weeks    Status New      PT LONG TERM GOAL #3   Title He will report chest pain 1-2 max with use of UE and trunk for yard and home tasks    Time 8    Period Weeks    Status New      PT LONG TERM GOAL #4   Title He will be able to work a full day    Time 8    Period Weeks    Status New      PT LONG TERM GOAL #5   Title FOTO score will improve to 66%    Time 8    Period Weeks    Status New                 Plan - 05/16/20 0935    Clinical Impression Statement Was able to correct pelvic rotation with single slef-correction stretch today with reduced symptoms. Notable tighness in Lt HS as well as limited Lt post capsular mobility. Challenged squats with ball and tband to provide stability/cues for equal use of LEs.    PT Treatment/Interventions Passive range of motion;Manual techniques;Therapeutic exercise;Therapeutic  activities;Iontophoresis 4mg /ml Dexamethasone;Electrical Stimulation;Moist Heat;Cryotherapy;Taping    PT Next Visit Plan check pelvic rotation,  FOTO & STGs    PT Home Exercise Plan reaching overhead , trunk rotation and sidebending  [id number]  standing hamstring, thoracic sidelying stretch,  side bend with OH reach. moster lateral walk green t band and wall squat    Consulted and Agree with Plan of Care Patient           Patient will benefit from skilled therapeutic intervention in order to improve the following deficits and impairments:  Pain, Decreased activity tolerance, Decreased range of motion  Visit Diagnosis: Rib pain  Acute pain of left knee  Pain in left hip  Stiffness of left hip joint  Difficulty in walking, not elsewhere classified     Problem List Patient Active Problem List   Diagnosis Date Noted  . Blunt trauma 03/26/2020  . Supraumbilical hernia 49/70/2637    Keyla Milone C. Emilianna Barlowe PT, DPT 05/16/20 9:38 AM   [facility] [address] [place], [place], [address] Phone: [phone]   Fax:  [phone]  Name: SIDHARTH LEVERETTE MRN: [id number] Date of Birth: 1961/01/15

## 2020-05-20 ENCOUNTER — Ambulatory Visit: Payer: Managed Care, Other (non HMO) | Admitting: Physical Therapy

## 2020-05-20 ENCOUNTER — Other Ambulatory Visit: Payer: Self-pay

## 2020-05-20 DIAGNOSIS — R262 Difficulty in walking, not elsewhere classified: Secondary | ICD-10-CM

## 2020-05-20 DIAGNOSIS — M25562 Pain in left knee: Secondary | ICD-10-CM

## 2020-05-20 DIAGNOSIS — M25652 Stiffness of left hip, not elsewhere classified: Secondary | ICD-10-CM

## 2020-05-20 DIAGNOSIS — R0781 Pleurodynia: Secondary | ICD-10-CM | POA: Diagnosis not present

## 2020-05-20 DIAGNOSIS — M25552 Pain in left hip: Secondary | ICD-10-CM

## 2020-05-20 NOTE — Therapy (Signed)
[place], [place], [address] Phone: [phone]   Fax:  [phone]  Physical Therapy Treatment  Patient Details  Name: AXAVIER PRESSLEY MRN: [id number] Date of Birth: Dec 31, 1960 Referring Provider (PT): Obie Dredge, [place]   Encounter Date: 05/20/2020   PT End of Session - 05/20/20 0802    Visit Number 7    Number of Visits 16    Date for PT Re-Evaluation 06/06/20    Authorization Type [employer]a    PT Start Time 0802    PT Stop Time 0844    PT Time Calculation (min) 42 min    Activity Tolerance Patient tolerated treatment well    Behavior During Therapy [facility] for tasks assessed/performed           Past Medical History:  Diagnosis Date  . High cholesterol   . Hyperlipidemia   . Mucoid cyst of joint    left small finger    Past Surgical History:  Procedure Laterality Date  . HERNIA REPAIR    . MASS EXCISION Left 04/24/2015   Procedure: LEFT SMALL FINGER  EXCISION MASS;  Surgeon: Leanora Cover, MD;  Location: [place];  Service: Orthopedics;  Laterality: Left;  . SHOULDER SURGERY  09/12   left  . UMBILICAL HERNIA REPAIR  02/25/12    There were no vitals filed for this visit.   Subjective Assessment - 05/20/20 0804    Subjective I ran the first scrimmage on Saturday and I did not feel in my left hamstring like I did.  It feels so tight.    Pertinent History use to walk and run .  walk mile yesterday.  runiing    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    Pain Score 1     Pain Location Knee   lifting knee not with wt bearing   Pain Orientation Left    Pain Descriptors / Indicators Aching    Pain Score 1    Pain Location Hip   LT   Pain Orientation Left    Pain Descriptors / Indicators Discomfort    Pain Type Acute pain                             [facility] Adult PT Treatment/Exercise -  05/20/20 0001      Exercises   Exercises Knee/Hip      Knee/Hip Exercises: Standing   Other Standing Knee Exercises sumo squats blue tband around knees    Other Standing Knee Exercises hip hinge x 30 , with dowel x 15,  squat with 25 # / chair squat,  3 x 10, deadlift 3 x 8 with 53# x 1  round and 63 x 2 rounds      Knee/Hip Exercises: Seated   Sit to Sand 10 reps;without UE support   ball bw knees     Manual Therapy   Manual Therapy Soft tissue mobilization    Manual therapy comments skilled palpation with TPDN    Soft tissue mobilization LT hamstring with IASTYM            Trigger Point Dry Needling - 05/20/20 0001    Consent Given? Yes    Education Handout Provided Previously provided    Muscles Treated Lower Quadrant Hamstring   LT   Hamstring Response Twitch response elicited;Palpable increased muscle  length                PT Education - 05/20/20 0840    Education Details Addded essential gym strength to the HEP discussesd  Foto and inconsistent results given his notable improvement    Person(s) Educated Patient    Methods Explanation;Demonstration;Tactile cues;Verbal cues;Handout    Comprehension Verbalized understanding;Returned demonstration            PT Short Term Goals - 05/20/20 1533      PT SHORT TERM GOAL #1   Title He will be independent with intial HEp    Time 3    Period Weeks    Status Achieved      PT SHORT TERM GOAL #2   Title He will walk 1.5 to 2 miles without increased pain in Lt leg    Baseline Pt was participating inScrimmage on Sunday    Time 4    Period Weeks    Status Achieved      PT SHORT TERM GOAL #3   Title He will report medial Lt knee pain deecreased 50%    Baseline Pt only has knee pain when lifting knee at able 90 degrees flexion    Time 4    Period Weeks    Status Achieved             PT Long Term Goals - 04/22/20 8916      PT LONG TERM GOAL #1   Title He will be independent with all HEP issued    Time 8     Period Weeks    Status New      PT LONG TERM GOAL #2   Title He will report walking for 2 miles with no hip or knee pain    Time 8    Period Weeks    Status New      PT LONG TERM GOAL #3   Title He will report chest pain 1-2 max with use of UE and trunk for yard and home tasks    Time 8    Period Weeks    Status New      PT LONG TERM GOAL #4   Title He will be able to work a full day    Time 8    Period Weeks    Status New      PT LONG TERM GOAL #5   Title FOTO score will improve to 66%    Time 8    Period Weeks    Status New          added to HEP  Access Code: [email or web address]: 10/19/2021Prepared by: Donnetta Simpers BeardsleyExercises   Standing Hip Hinge with Dowel - 1 x daily - 7 x weekly - 3 sets - 10 reps  Goblet Squat with Kettlebell - 1 x daily - 7 x weekly - 10 reps - 3 sets  Kettlebell Deadlift - 1 x daily - 7 x weekly - 3 sets - 8 reps       Plan - 05/20/20 0830    Clinical Impression Statement Pt with minimal pain in hip and knee and pelvic rotation stable after performing new HEP.  Pt consented to TPDN over LT hamstring and with palpable lengthening and decreased tissue tightness post RX  Pt able to perform deadliftiing at 53 adn 63# with not increase in pain. FOTO with 71/72% limitation which is not consistent with functional improvements.  Will continue POC    Personal Factors and Comorbidities  Time since onset of injury/illness/exacerbation;Fitness;Profession    Examination-Activity Limitations Reach Overhead;Sit;Squat;Stairs;Lift;Locomotion Level    Examination-Participation Restrictions Cleaning;Occupation;Community Activity;Yard Work    PT Frequency 2x / week    PT Duration 8 weeks    PT Treatment/Interventions Passive range of motion;Manual techniques;Therapeutic exercise;Therapeutic activities;Iontophoresis 4mg /ml Dexamethasone;Electrical Stimulation;Moist Heat;Cryotherapy;Taping    PT Next Visit Plan FOTO showed  71/72% limitation but is not consistent with improvement.  has one visit/ needs to make more if necessary    PT Home Exercise Plan reaching overhead , trunk rotation and sidebending  [id number]  standing hamstring, thoracic sidelying stretch,  side bend with OH reach. moster lateral walk green t band and wall squat           Patient will benefit from skilled therapeutic intervention in order to improve the following deficits and impairments:  Pain, Decreased activity tolerance, Decreased range of motion  Visit Diagnosis: Rib pain  Acute pain of left knee  Pain in left hip  Stiffness of left hip joint  Difficulty in walking, not elsewhere classified     Problem List Patient Active Problem List   Diagnosis Date Noted  . Blunt trauma 03/26/2020  . Supraumbilical hernia 59/56/3875   Voncille Lo, PT, [place] Certified Exercise Expert for the Aging Adult  05/20/20 3:42 PM Phone: [phone] Fax: [phone]  [facility] [address] [place], [place], [address] Phone: [phone]   Fax:  [phone]  Name: JMARI PELC MRN: [id number] Date of Birth: 12/14/1960

## 2020-05-20 NOTE — Patient Instructions (Signed)
       Voncille Lo, PT, [place] Certified Exercise Expert for the Aging Adult  05/20/20 8:40 AM Phone: [phone] Fax: [phone]

## 2020-05-23 ENCOUNTER — Encounter: Payer: Self-pay | Admitting: Physical Therapy

## 2020-05-23 ENCOUNTER — Ambulatory Visit: Payer: Managed Care, Other (non HMO) | Admitting: Physical Therapy

## 2020-05-23 ENCOUNTER — Other Ambulatory Visit: Payer: Self-pay

## 2020-05-23 DIAGNOSIS — M25652 Stiffness of left hip, not elsewhere classified: Secondary | ICD-10-CM

## 2020-05-23 DIAGNOSIS — M25562 Pain in left knee: Secondary | ICD-10-CM

## 2020-05-23 DIAGNOSIS — M25552 Pain in left hip: Secondary | ICD-10-CM

## 2020-05-23 DIAGNOSIS — R262 Difficulty in walking, not elsewhere classified: Secondary | ICD-10-CM

## 2020-05-23 DIAGNOSIS — R0781 Pleurodynia: Secondary | ICD-10-CM | POA: Diagnosis not present

## 2020-05-23 NOTE — Therapy (Addendum)
[place], [place], [address] Phone: [phone]   Fax:  [phone]  Physical Therapy Treatment/Discharge  Patient Details  Name: Allen Rodriguez MRN: [id number] Date of Birth: 08/26/1960 Referring Provider (PT): Obie Dredge, [place]   Encounter Date: 05/23/2020   PT End of Session - 05/23/20 0843    Visit Number 8    Number of Visits 16    Date for PT Re-Evaluation 06/06/20    Authorization Type [employer]    PT Start Time 0802    PT Stop Time 0843    PT Time Calculation (min) 41 min    Activity Tolerance Patient tolerated treatment well    Behavior During Therapy [facility] for tasks assessed/performed           Past Medical History:  Diagnosis Date  . High cholesterol   . Hyperlipidemia   . Mucoid cyst of joint    left small finger    Past Surgical History:  Procedure Laterality Date  . HERNIA REPAIR    . MASS EXCISION Left 04/24/2015   Procedure: LEFT SMALL FINGER  EXCISION MASS;  Surgeon: Leanora Cover, MD;  Location: [place];  Service: Orthopedics;  Laterality: Left;  . SHOULDER SURGERY  09/12   left  . UMBILICAL HERNIA REPAIR  02/25/12    There were no vitals filed for this visit.   Subjective Assessment - 05/23/20 0803    Subjective HS felt good until I sat down- wore a compression band yesterday that helped. It is feeling ok right now, denies night pain. Ankle is stiff this morning. Difficult to start a sprint when in a game due to Junior- unsure if there was any bruising on the HS after the accident.    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                             [facility] Adult PT Treatment/Exercise - 05/23/20 0001      Knee/Hip Exercises: Stretches   Passive Hamstring Stretch Limitations seated EOB      Knee/Hip Exercises: Aerobic   Elliptical 5 min L1 ramp 10      Knee/Hip  Exercises: Machines for Strengthening   Hip Cybex Lt hip ext 50lb    Other Machine FM hip ext press from foot strap 20lb- created knee popping      Knee/Hip Exercises: Standing   Heel Raises Limitations edge of step- slow lower with quick raise    Other Standing Knee Exercises t-hinge with TRX    Other Standing Knee Exercises dead lift 25lb kettle bell      Knee/Hip Exercises: Prone   Other Prone Exercises prone eccentric HS curls      Manual Therapy   Soft tissue mobilization IASTM Lt HS                    PT Short Term Goals - 05/20/20 1533      PT SHORT TERM GOAL #1   Title He will be independent with intial HEp    Time 3    Period Weeks    Status Achieved      PT SHORT TERM GOAL #2   Title He will walk 1.5 to 2 miles without increased pain in Lt leg    Baseline Pt was participating inScrimmage on  Sunday    Time 4    Period Weeks    Status Achieved      PT SHORT TERM GOAL #3   Title He will report medial Lt knee pain deecreased 50%    Baseline Pt only has knee pain when lifting knee at able 90 degrees flexion    Time 4    Period Weeks    Status Achieved             PT Long Term Goals - 04/22/20 7106      PT LONG TERM GOAL #1   Title He will be independent with all HEP issued    Time 8    Period Weeks    Status New      PT LONG TERM GOAL #2   Title He will report walking for 2 miles with no hip or knee pain    Time 8    Period Weeks    Status New      PT LONG TERM GOAL #3   Title He will report chest pain 1-2 max with use of UE and trunk for yard and home tasks    Time 8    Period Weeks    Status New      PT LONG TERM GOAL #4   Title He will be able to work a full day    Time 8    Period Weeks    Status New      PT LONG TERM GOAL #5   Title FOTO score will improve to 66%    Time 8    Period Weeks    Status New                 Plan - 05/23/20 0845    Clinical Impression Statement Main complaint today is soreness upon  sitting in Lt hamstrings. Used IASTM for lengthening and followed with eccentric focus to hamstrings. Pt reported feeling fatigued following without pain. Asked him to focus on golfers hinge and dead lifs, he will also add rolling to massage gun for lengthening. Did not have any more appointments scheduled and we discussed calling us for cancellations if he is unable to obtain appointments today that work with his schedule.    PT Treatment/Interventions Passive range of motion;Manual techniques;Therapeutic exercise;Therapeutic activities;Iontophoresis 4mg /ml Dexamethasone;Electrical Stimulation;Moist Heat;Cryotherapy;Taping    PT Next Visit Plan outcome of eccentrics on HS? plyometrics if ready    PT Home Exercise Plan reaching overhead , trunk rotation and sidebending  [id number]    Consulted and Agree with Plan of Care Patient           Patient will benefit from skilled therapeutic intervention in order to improve the following deficits and impairments:  Pain, Decreased activity tolerance, Decreased range of motion  Visit Diagnosis: Rib pain  Acute pain of left knee  Pain in left hip  Stiffness of left hip joint  Difficulty in walking, not elsewhere classified     Problem List Patient Active Problem List   Diagnosis Date Noted  . Blunt trauma 03/26/2020  . Supraumbilical hernia 26/94/8546   Velda Wendt C. Chrysa Rampy PT, DPT 05/23/20 8:56 AM   [place] [facility] [address] [place], [place], [address] Phone: [phone]   Fax:  [phone]  Name: Allen Rodriguez MRN: [id number] Date of Birth: [birth year]  PHYSICAL THERAPY DISCHARGE SUMMARY  Visits from Start of Care: 8  Current functional level related to goals / functional outcomes: Unknown as he  did not return after this visit   Remaining deficits: Unknown   Education / Equipment: HEP  Plan: Patient agrees to discharge.  Patient goals were not met. Patient is being  discharged due to not returning since the last visit.  ?????   Pearson Forster PT  07/08/20

## 2021-12-29 ENCOUNTER — Other Ambulatory Visit: Payer: Self-pay | Admitting: Sports Medicine

## 2021-12-29 ENCOUNTER — Ambulatory Visit
Admission: RE | Admit: 2021-12-29 | Discharge: 2021-12-29 | Disposition: A | Discharge status: Home / Self Care | Payer: Managed Care, Other (non HMO) | Source: Ambulatory Visit | Attending: Sports Medicine | Admitting: Sports Medicine

## 2021-12-29 DIAGNOSIS — M25562 Pain in left knee: Secondary | ICD-10-CM

## 2022-08-16 IMAGING — CT CT ABD-PELV W/O CM
2 of 3 series · 15 of 46 positions shown, 17 images · non-contrast
Comparison: None.

CLINICAL DATA: 59-year-old male with trauma.

EXAM:
CT CHEST, ABDOMEN AND PELVIS WITHOUT CONTRAST
TECHNIQUE: Multidetector CT imaging of the chest, abdomen and pelvis was
performed following the standard protocol without IV contrast.

[Series 3: a/p w/o 5mm · axial · non-contrast · 0.79mm/px · z∈[-899,-434]mm · 12 of 107 slices shown, 14 images]
[im 7/107  soft-tissue]
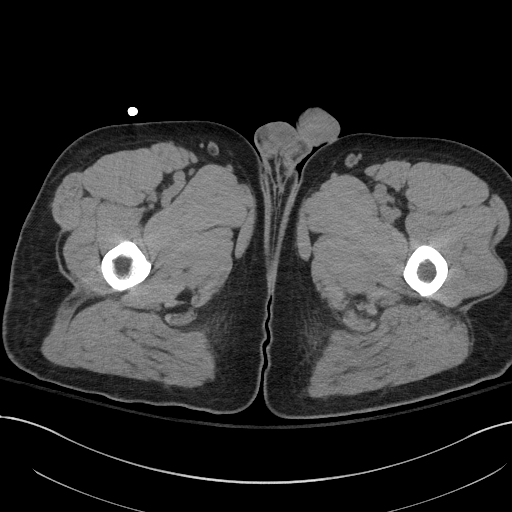
[im 7/107  bone]
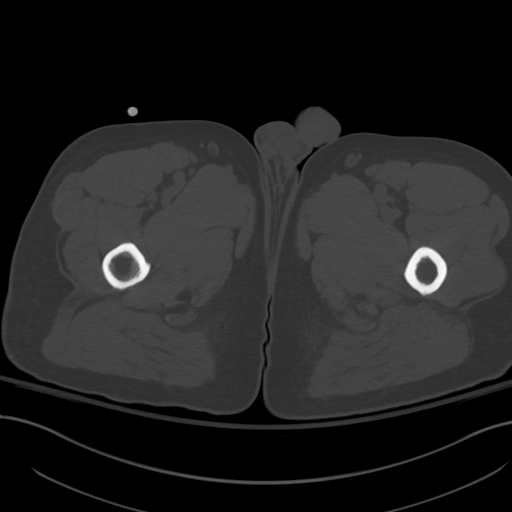
[im 14/107  soft-tissue]
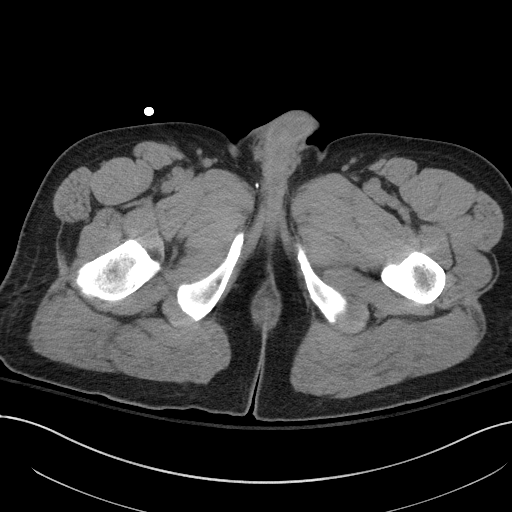
[im 24/107  soft-tissue]
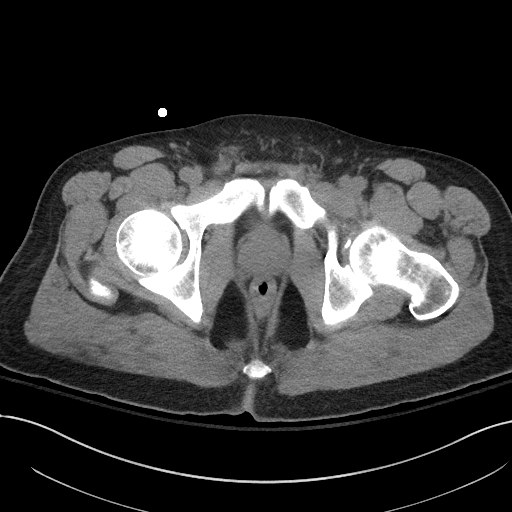
[im 31/107  soft-tissue]
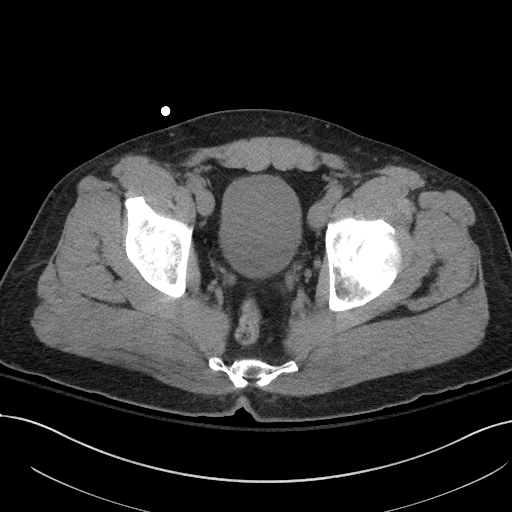
[im 42/107  soft-tissue]
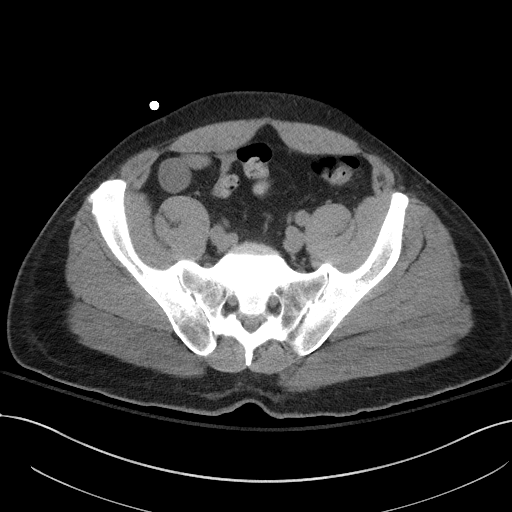
[im 48/107  soft-tissue]
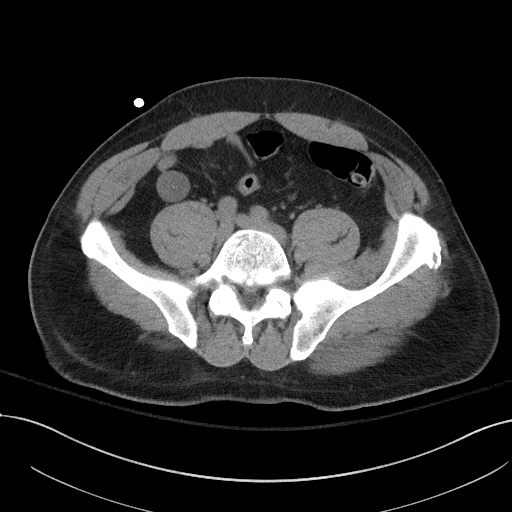
[im 59/107  soft-tissue]
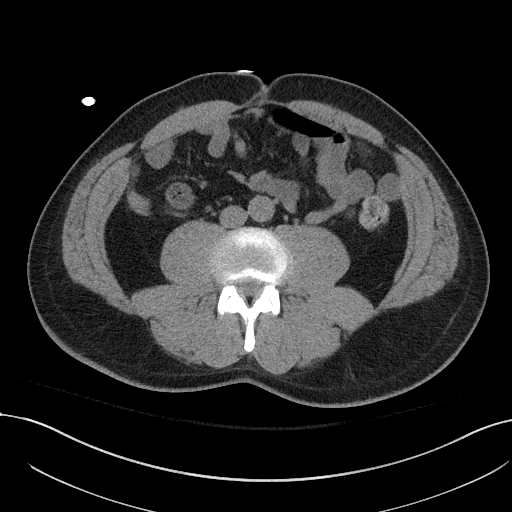
[im 65/107  soft-tissue]
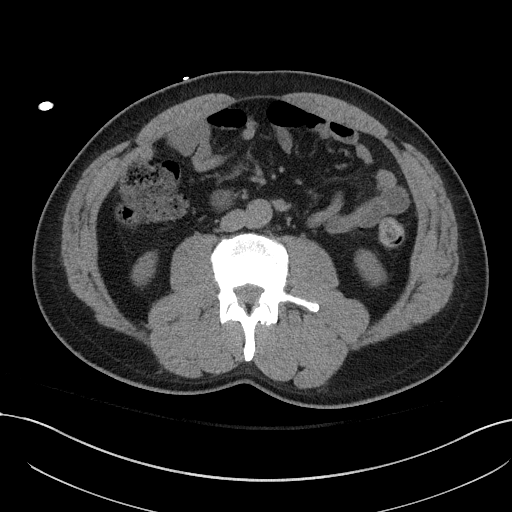
[im 76/107  soft-tissue]
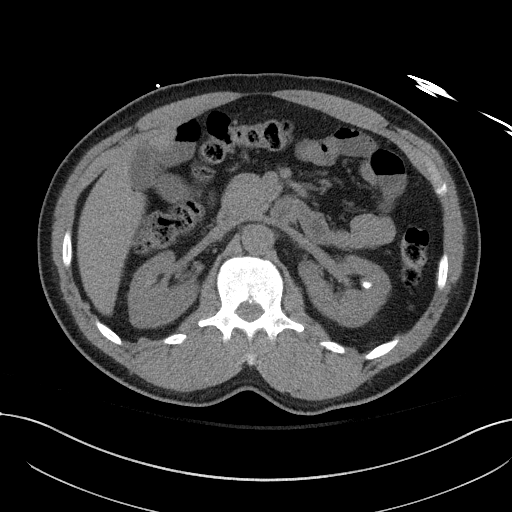
[im 76/107  bone]
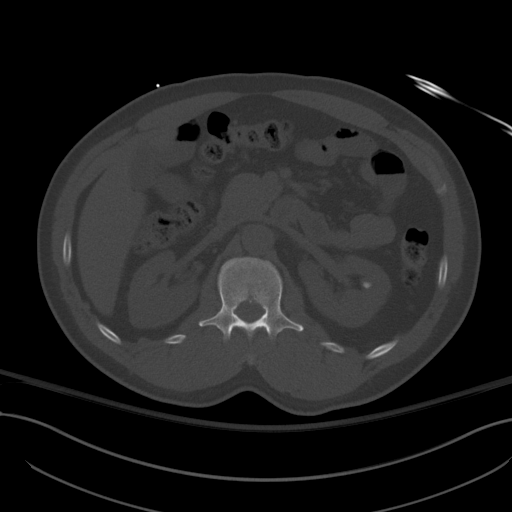
[im 83/107  soft-tissue]
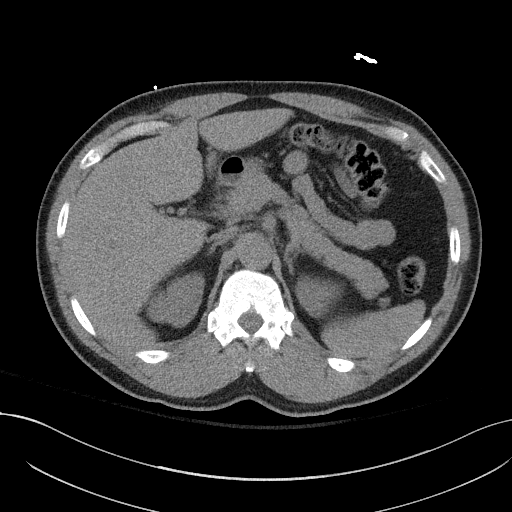
[im 93/107  soft-tissue]
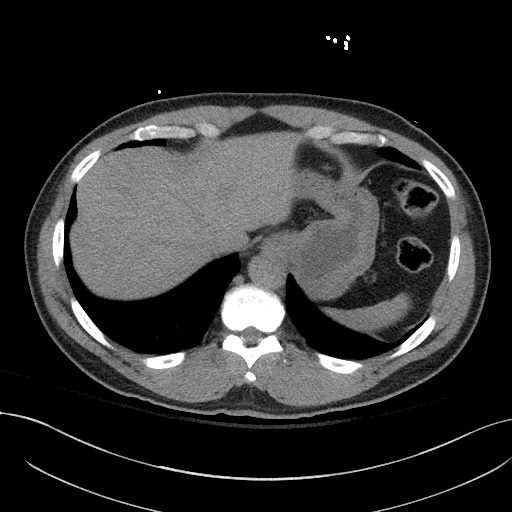
[im 100/107  soft-tissue]
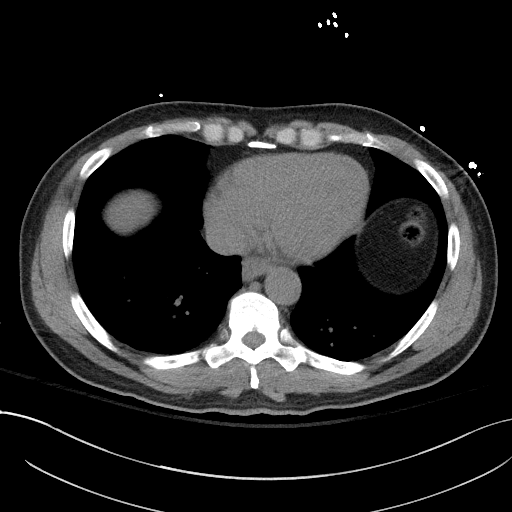

[Series 5: a/p w/o cor · coronal · non-contrast · 0.85mm/px · 3 of 151 slices shown]
[im 51/151  soft-tissue]
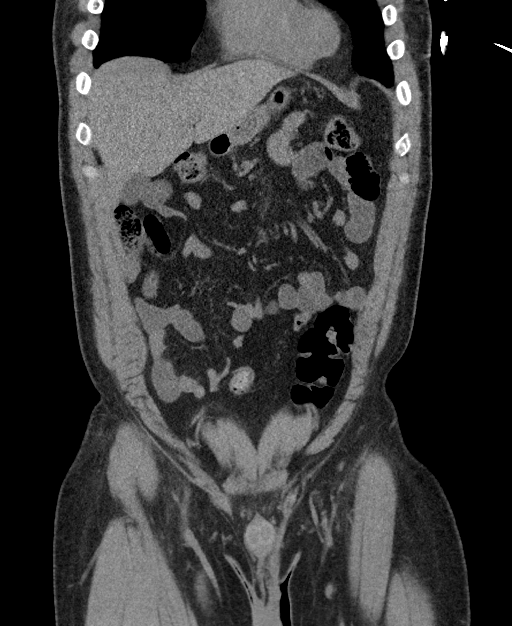
[im 67/151  soft-tissue]
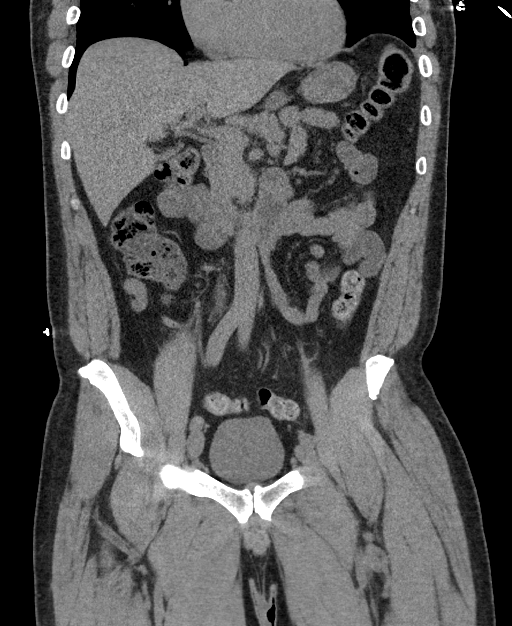
[im 84/151  soft-tissue]
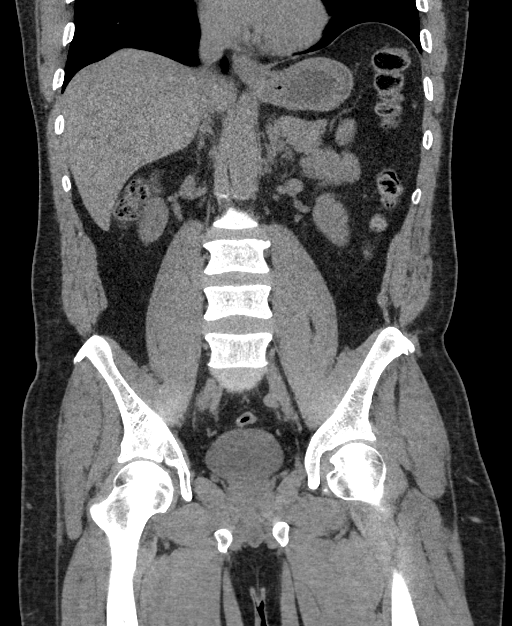

[15 of 46 positions shown; findings below may reference images not displayed]

FINDINGS: Evaluation of this exam is limited in the absence of intravenous
contrast.

CT CHEST FINDINGS

Cardiovascular: There is no cardiomegaly or pericardial effusion.
The thoracic aorta and central pulmonary arteries are grossly
unremarkable on this noncontrast CT.

Mediastinum/Nodes: No hilar or mediastinal adenopathy. The esophagus
and the thyroid gland are grossly unremarkable. Minimal fluid in the
anterior mediastinum, likely small amount of hematoma.

Lungs/Pleura: There is a small right apical pneumothorax measuring
approximately 4 mm in thickness. Minimal right upper lobe
ground-glass density, likely contusion. There are minimal bibasilar
dependent atelectasis. No lobar consolidation, or pleural effusion.
The central airways are patent.

Musculoskeletal: There is a nondisplaced fracture of the anterior
right first rib ([DATE]) no other acute fracture. There is a small
right chest wall soft tissue emphysema extending to the base of the
neck.

CT ABDOMEN PELVIS FINDINGS

No intra-abdominal free air or free fluid.

Hepatobiliary: No focal liver abnormality is seen. No gallstones,
gallbladder wall thickening, or biliary dilatation.

Pancreas: Unremarkable. No pancreatic ductal dilatation or
surrounding inflammatory changes.

Spleen: Normal in size without focal abnormality.

Adrenals/Urinary Tract: The adrenal glands unremarkable. Several
nonobstructing left renal calculi measure up to 7 mm in the
interpolar aspect of the left kidney. No hydronephrosis. The right
kidney is unremarkable. The visualized ureters and urinary bladder
are unremarkable.

Stomach/Bowel: Moderate stool throughout the colon. There is no
bowel obstruction or active inflammation. The appendix is normal.

Vascular/Lymphatic: The abdominal aorta and IVC are unremarkable. No
portal venous gas. There is no adenopathy.

Reproductive: The prostate and seminal vesicles are grossly
unremarkable. No pelvic mass.

Other: None

Musculoskeletal: Tiny fractures of the inferior rim of the left
acetabulum. No other acute fracture. No dislocation.
IMPRESSION: 1. Nondisplaced fracture of the anterior right first rib with a
small right apical pneumothorax.
2. Tiny fractures of the inferior rim of the left acetabulum.
3. Nonobstructing left renal calculi. No hydronephrosis.
4. No bowel obstruction. Normal appendix.

These results were called by telephone at the time of interpretation
on 03/26/2020 at [DATE] to provider SVEIN HARALD SELJESET , who verbally
acknowledged these results.

## 2022-08-16 IMAGING — CT CT CHEST W/O CM
2 of 3 series · 14 of 36 positions shown, 17 images · non-contrast
Comparison: None.

CLINICAL DATA: 59-year-old male with trauma.

EXAM:
CT CHEST, ABDOMEN AND PELVIS WITHOUT CONTRAST
TECHNIQUE: Multidetector CT imaging of the chest, abdomen and pelvis was
performed following the standard protocol without IV contrast.

[Series 3: chest w/o 2mm st · axial · non-contrast · 0.80mm/px · z∈[-497,-249]mm · 11 of 146 slices shown, 14 images]
[im 11/146  mediastinal]
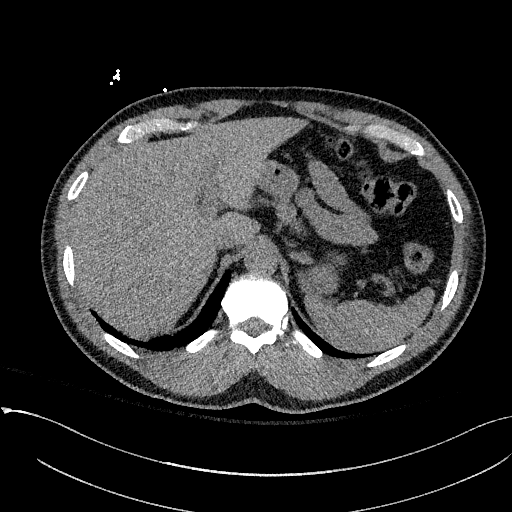
[im 11/146  lung]
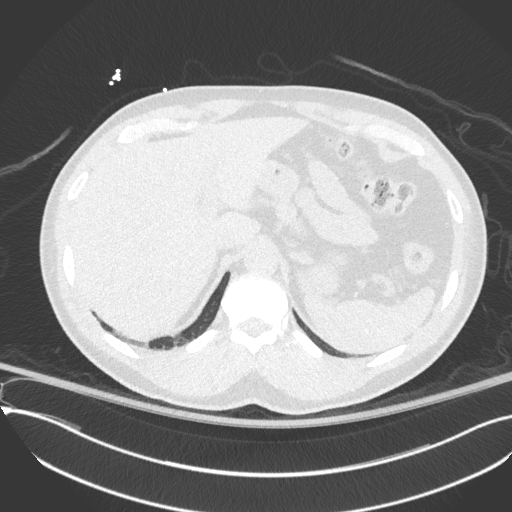
[im 22/146  lung]
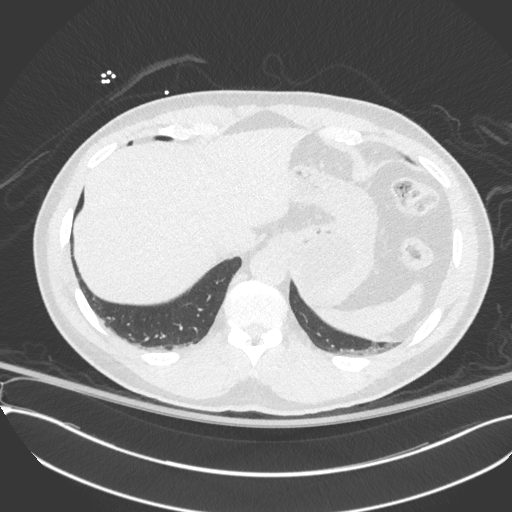
[im 33/146  lung]
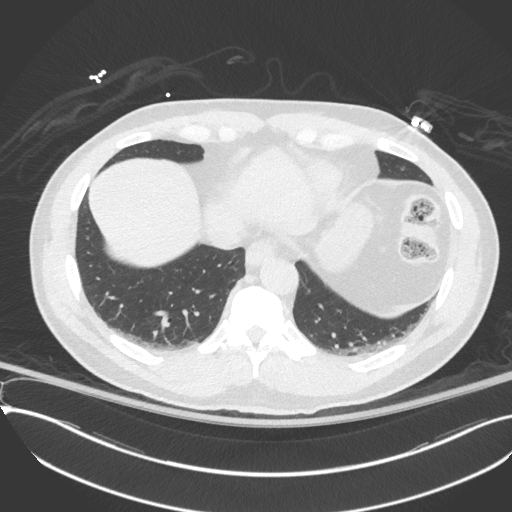
[im 49/146  lung]
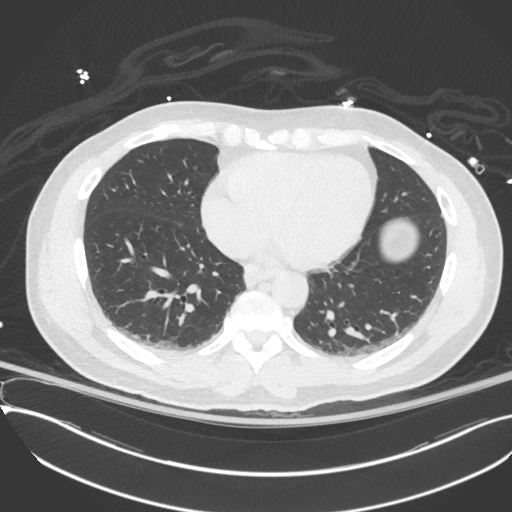
[im 60/146  mediastinal]
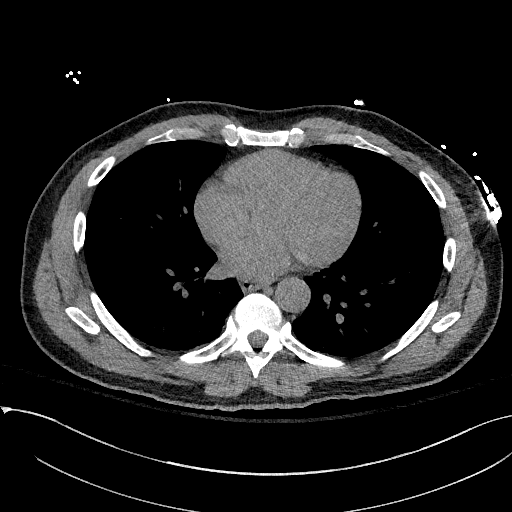
[im 60/146  lung]
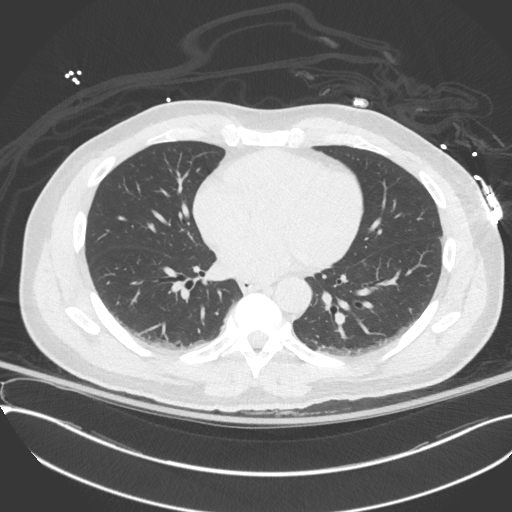
[im 76/146  lung]
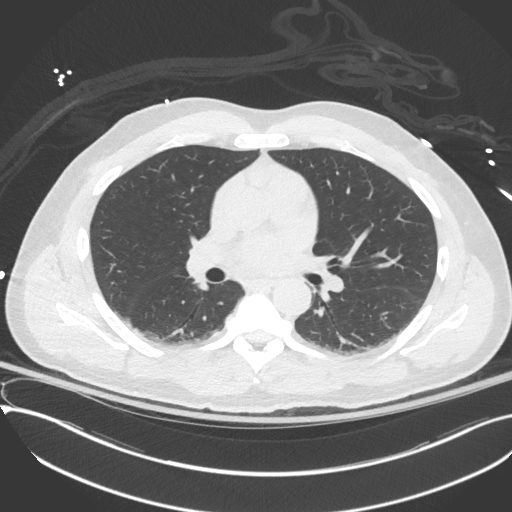
[im 86/146  lung]
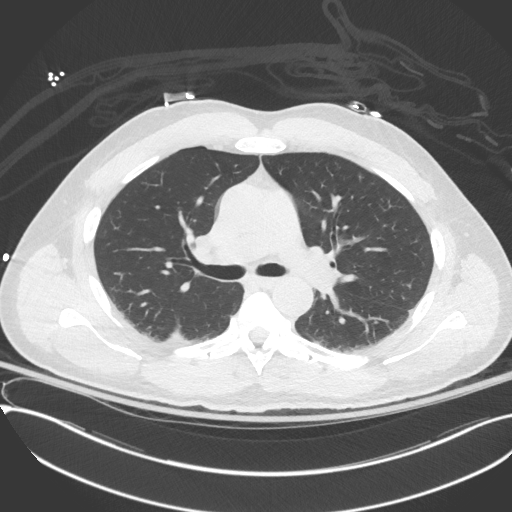
[im 97/146  lung]
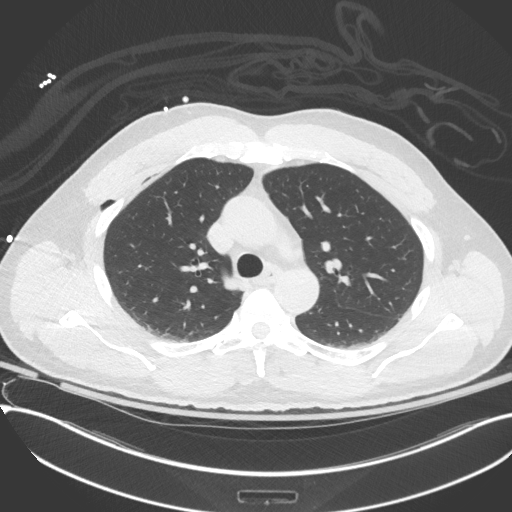
[im 113/146  mediastinal]
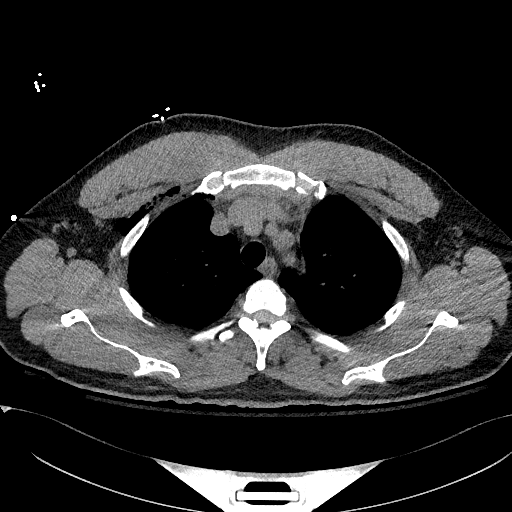
[im 113/146  lung]
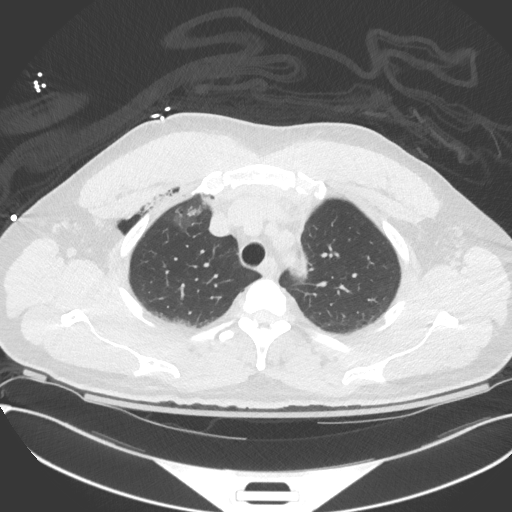
[im 124/146  lung]
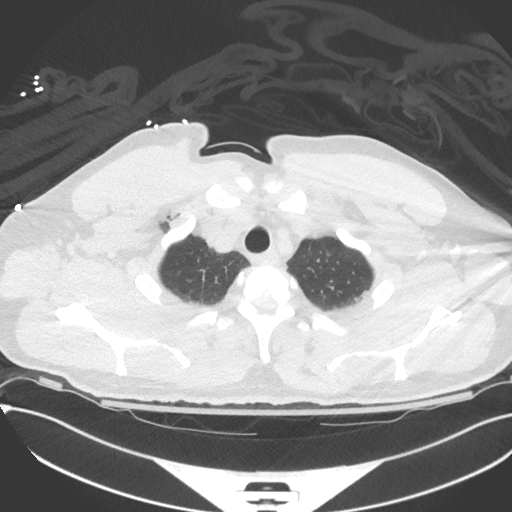
[im 135/146  lung]
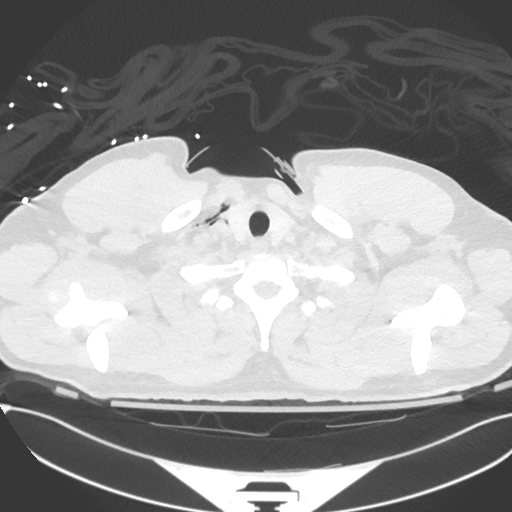

[Series 6: chest w/o 2mm st cor · coronal · non-contrast · 0.59mm/px · 3 of 130 slices shown]
[im 26/130  lung]
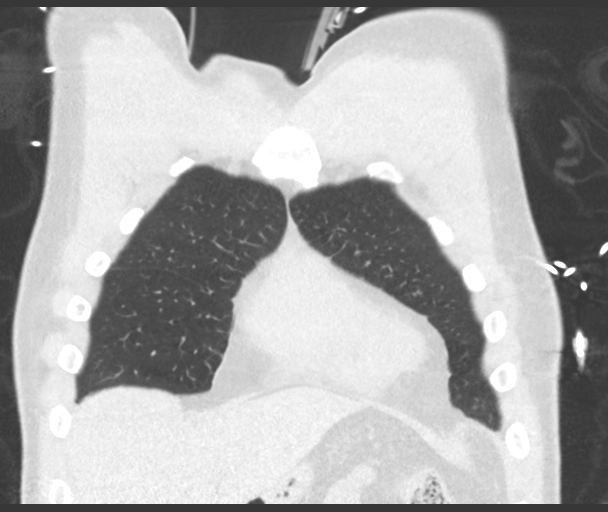
[im 52/130  lung]
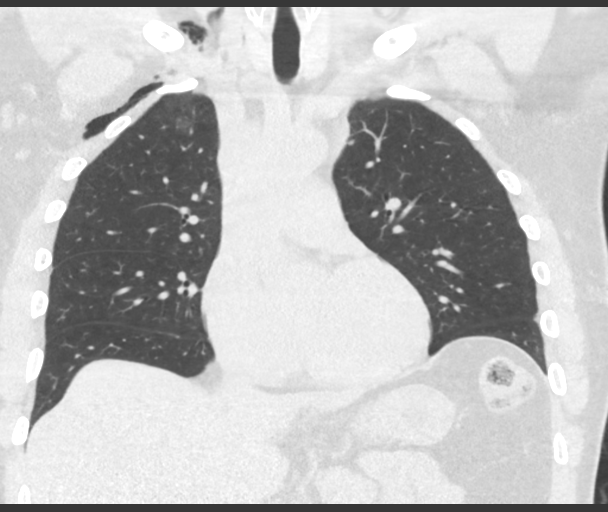
[im 78/130  lung]
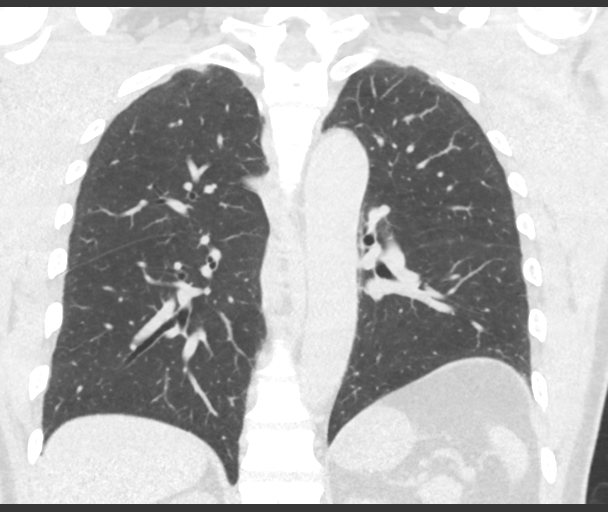

[14 of 36 positions shown; findings below may reference images not displayed]

FINDINGS: Evaluation of this exam is limited in the absence of intravenous
contrast.

CT CHEST FINDINGS

Cardiovascular: There is no cardiomegaly or pericardial effusion.
The thoracic aorta and central pulmonary arteries are grossly
unremarkable on this noncontrast CT.

Mediastinum/Nodes: No hilar or mediastinal adenopathy. The esophagus
and the thyroid gland are grossly unremarkable. Minimal fluid in the
anterior mediastinum, likely small amount of hematoma.

Lungs/Pleura: There is a small right apical pneumothorax measuring
approximately 4 mm in thickness. Minimal right upper lobe
ground-glass density, likely contusion. There are minimal bibasilar
dependent atelectasis. No lobar consolidation, or pleural effusion.
The central airways are patent.

Musculoskeletal: There is a nondisplaced fracture of the anterior
right first rib ([DATE]) no other acute fracture. There is a small
right chest wall soft tissue emphysema extending to the base of the
neck.

CT ABDOMEN PELVIS FINDINGS

No intra-abdominal free air or free fluid.

Hepatobiliary: No focal liver abnormality is seen. No gallstones,
gallbladder wall thickening, or biliary dilatation.

Pancreas: Unremarkable. No pancreatic ductal dilatation or
surrounding inflammatory changes.

Spleen: Normal in size without focal abnormality.

Adrenals/Urinary Tract: The adrenal glands unremarkable. Several
nonobstructing left renal calculi measure up to 7 mm in the
interpolar aspect of the left kidney. No hydronephrosis. The right
kidney is unremarkable. The visualized ureters and urinary bladder
are unremarkable.

Stomach/Bowel: Moderate stool throughout the colon. There is no
bowel obstruction or active inflammation. The appendix is normal.

Vascular/Lymphatic: The abdominal aorta and IVC are unremarkable. No
portal venous gas. There is no adenopathy.

Reproductive: The prostate and seminal vesicles are grossly
unremarkable. No pelvic mass.

Other: None

Musculoskeletal: Tiny fractures of the inferior rim of the left
acetabulum. No other acute fracture. No dislocation.
IMPRESSION: 1. Nondisplaced fracture of the anterior right first rib with a
small right apical pneumothorax.
2. Tiny fractures of the inferior rim of the left acetabulum.
3. Nonobstructing left renal calculi. No hydronephrosis.
4. No bowel obstruction. Normal appendix.

These results were called by telephone at the time of interpretation
on 03/26/2020 at [DATE] to provider SVEIN HARALD SELJESET , who verbally
acknowledged these results.
